# Patient Record
Sex: Male | Born: 1935 | Race: White | Hispanic: No | Marital: Married
Health system: Southern US, Community
[De-identification: ages and names within clinical notes are randomized; demographics above are authoritative.]

## PROBLEM LIST (undated history)

## (undated) ENCOUNTER — Ambulatory Visit (HOSPITAL_COMMUNITY): Admission: EM | Payer: Medicare Other

## (undated) DIAGNOSIS — I739 Peripheral vascular disease, unspecified: Secondary | ICD-10-CM

## (undated) DIAGNOSIS — F32A Depression, unspecified: Secondary | ICD-10-CM

## (undated) DIAGNOSIS — E119 Type 2 diabetes mellitus without complications: Secondary | ICD-10-CM

## (undated) DIAGNOSIS — E785 Hyperlipidemia, unspecified: Secondary | ICD-10-CM

## (undated) DIAGNOSIS — Z89619 Acquired absence of unspecified leg above knee: Secondary | ICD-10-CM

## (undated) DIAGNOSIS — Z89519 Acquired absence of unspecified leg below knee: Secondary | ICD-10-CM

## (undated) DIAGNOSIS — M1990 Unspecified osteoarthritis, unspecified site: Secondary | ICD-10-CM

## (undated) DIAGNOSIS — I219 Acute myocardial infarction, unspecified: Secondary | ICD-10-CM

## (undated) DIAGNOSIS — F329 Major depressive disorder, single episode, unspecified: Secondary | ICD-10-CM

## (undated) DIAGNOSIS — K219 Gastro-esophageal reflux disease without esophagitis: Secondary | ICD-10-CM

## (undated) DIAGNOSIS — D649 Anemia, unspecified: Secondary | ICD-10-CM

## (undated) DIAGNOSIS — G459 Transient cerebral ischemic attack, unspecified: Secondary | ICD-10-CM

## (undated) DIAGNOSIS — N400 Enlarged prostate without lower urinary tract symptoms: Secondary | ICD-10-CM

## (undated) DIAGNOSIS — L8990 Pressure ulcer of unspecified site, unspecified stage: Secondary | ICD-10-CM

## (undated) DIAGNOSIS — M810 Age-related osteoporosis without current pathological fracture: Secondary | ICD-10-CM

## (undated) DIAGNOSIS — Z951 Presence of aortocoronary bypass graft: Secondary | ICD-10-CM

## (undated) DIAGNOSIS — I10 Essential (primary) hypertension: Secondary | ICD-10-CM

## (undated) DIAGNOSIS — R4701 Aphasia: Secondary | ICD-10-CM

## (undated) DIAGNOSIS — I2510 Atherosclerotic heart disease of native coronary artery without angina pectoris: Secondary | ICD-10-CM

## (undated) DIAGNOSIS — R627 Adult failure to thrive: Secondary | ICD-10-CM

## (undated) HISTORY — PX: CARDIAC CATHETERIZATION: SHX172

## (undated) HISTORY — PX: VASCULAR SURGERY: SHX849

---

## 2005-03-03 ENCOUNTER — Ambulatory Visit (HOSPITAL_COMMUNITY): Admission: RE | Admit: 2005-03-03 | Discharge: 2005-03-03 | Payer: Self-pay | Admitting: Cardiology

## 2005-03-11 ENCOUNTER — Inpatient Hospital Stay (HOSPITAL_COMMUNITY): Admission: RE | Admit: 2005-03-11 | Discharge: 2005-03-17 | Payer: Self-pay | Admitting: *Deleted

## 2005-07-08 ENCOUNTER — Ambulatory Visit (HOSPITAL_COMMUNITY): Admission: RE | Admit: 2005-07-08 | Discharge: 2005-07-09 | Payer: Self-pay | Admitting: *Deleted

## 2005-10-21 ENCOUNTER — Other Ambulatory Visit: Payer: Self-pay

## 2005-10-21 ENCOUNTER — Inpatient Hospital Stay: Payer: Self-pay | Admitting: Internal Medicine

## 2006-03-11 ENCOUNTER — Ambulatory Visit: Payer: Self-pay | Admitting: Unknown Physician Specialty

## 2006-03-28 ENCOUNTER — Ambulatory Visit: Payer: Self-pay | Admitting: Unknown Physician Specialty

## 2006-04-14 ENCOUNTER — Inpatient Hospital Stay: Payer: Self-pay | Admitting: Specialist

## 2006-04-15 ENCOUNTER — Other Ambulatory Visit: Payer: Self-pay

## 2006-04-23 ENCOUNTER — Encounter: Payer: Self-pay | Admitting: Internal Medicine

## 2006-10-12 ENCOUNTER — Ambulatory Visit: Payer: Self-pay | Admitting: *Deleted

## 2006-12-24 ENCOUNTER — Emergency Department: Payer: Self-pay | Admitting: Emergency Medicine

## 2007-01-20 ENCOUNTER — Ambulatory Visit: Payer: Self-pay | Admitting: Internal Medicine

## 2007-02-05 ENCOUNTER — Ambulatory Visit: Payer: Self-pay | Admitting: Internal Medicine

## 2007-03-07 ENCOUNTER — Ambulatory Visit: Payer: Self-pay | Admitting: Internal Medicine

## 2007-04-07 ENCOUNTER — Ambulatory Visit: Payer: Self-pay | Admitting: Internal Medicine

## 2007-04-27 ENCOUNTER — Ambulatory Visit: Payer: Self-pay | Admitting: *Deleted

## 2007-05-08 ENCOUNTER — Ambulatory Visit: Payer: Self-pay | Admitting: Internal Medicine

## 2007-06-07 ENCOUNTER — Ambulatory Visit: Payer: Self-pay | Admitting: Internal Medicine

## 2007-07-08 ENCOUNTER — Ambulatory Visit: Payer: Self-pay | Admitting: Internal Medicine

## 2007-08-07 ENCOUNTER — Ambulatory Visit: Payer: Self-pay | Admitting: Internal Medicine

## 2007-09-07 ENCOUNTER — Ambulatory Visit: Payer: Self-pay | Admitting: Internal Medicine

## 2007-10-08 ENCOUNTER — Ambulatory Visit: Payer: Self-pay | Admitting: Internal Medicine

## 2007-11-02 ENCOUNTER — Ambulatory Visit: Payer: Self-pay | Admitting: *Deleted

## 2007-11-05 ENCOUNTER — Ambulatory Visit: Payer: Self-pay | Admitting: Internal Medicine

## 2007-11-28 ENCOUNTER — Ambulatory Visit: Payer: Self-pay | Admitting: Internal Medicine

## 2007-12-06 ENCOUNTER — Ambulatory Visit: Payer: Self-pay | Admitting: Internal Medicine

## 2008-02-01 ENCOUNTER — Ambulatory Visit: Payer: Self-pay | Admitting: *Deleted

## 2008-02-07 ENCOUNTER — Ambulatory Visit: Payer: Self-pay | Admitting: Family Medicine

## 2008-02-08 ENCOUNTER — Ambulatory Visit: Payer: Self-pay | Admitting: Family Medicine

## 2008-02-26 ENCOUNTER — Ambulatory Visit: Payer: Self-pay | Admitting: Internal Medicine

## 2008-03-06 ENCOUNTER — Ambulatory Visit: Payer: Self-pay | Admitting: Internal Medicine

## 2008-03-18 ENCOUNTER — Ambulatory Visit: Payer: Self-pay | Admitting: Vascular Surgery

## 2008-05-31 ENCOUNTER — Ambulatory Visit: Payer: Self-pay | Admitting: Internal Medicine

## 2008-06-06 ENCOUNTER — Ambulatory Visit: Payer: Self-pay | Admitting: Internal Medicine

## 2008-09-06 ENCOUNTER — Ambulatory Visit: Payer: Self-pay | Admitting: Internal Medicine

## 2008-09-10 ENCOUNTER — Ambulatory Visit: Payer: Self-pay | Admitting: Internal Medicine

## 2008-10-07 ENCOUNTER — Ambulatory Visit: Payer: Self-pay | Admitting: Internal Medicine

## 2009-07-07 ENCOUNTER — Ambulatory Visit: Payer: Self-pay | Admitting: Internal Medicine

## 2009-07-25 ENCOUNTER — Ambulatory Visit: Payer: Self-pay | Admitting: Internal Medicine

## 2009-08-06 ENCOUNTER — Ambulatory Visit: Payer: Self-pay | Admitting: Internal Medicine

## 2009-09-06 ENCOUNTER — Ambulatory Visit: Payer: Self-pay | Admitting: Internal Medicine

## 2009-10-07 ENCOUNTER — Ambulatory Visit: Payer: Self-pay | Admitting: Internal Medicine

## 2009-11-04 ENCOUNTER — Ambulatory Visit: Payer: Self-pay | Admitting: Internal Medicine

## 2009-12-05 ENCOUNTER — Ambulatory Visit: Payer: Self-pay | Admitting: Internal Medicine

## 2010-01-04 ENCOUNTER — Ambulatory Visit: Payer: Self-pay | Admitting: Internal Medicine

## 2010-02-13 ENCOUNTER — Ambulatory Visit: Payer: Self-pay | Admitting: Internal Medicine

## 2010-03-06 ENCOUNTER — Ambulatory Visit: Payer: Self-pay | Admitting: Internal Medicine

## 2010-04-06 ENCOUNTER — Ambulatory Visit: Payer: Self-pay | Admitting: Internal Medicine

## 2010-05-07 ENCOUNTER — Ambulatory Visit: Payer: Self-pay | Admitting: Internal Medicine

## 2010-05-22 ENCOUNTER — Ambulatory Visit: Payer: Self-pay | Admitting: Internal Medicine

## 2010-06-06 ENCOUNTER — Ambulatory Visit: Payer: Self-pay | Admitting: Internal Medicine

## 2010-07-07 ENCOUNTER — Ambulatory Visit: Payer: Self-pay | Admitting: Internal Medicine

## 2010-07-09 ENCOUNTER — Emergency Department: Payer: Self-pay | Admitting: Emergency Medicine

## 2010-07-10 ENCOUNTER — Ambulatory Visit: Payer: Self-pay | Admitting: Internal Medicine

## 2010-10-15 ENCOUNTER — Inpatient Hospital Stay: Payer: Self-pay | Admitting: Internal Medicine

## 2010-10-20 ENCOUNTER — Encounter: Payer: Self-pay | Admitting: Internal Medicine

## 2010-11-05 ENCOUNTER — Ambulatory Visit: Payer: Self-pay | Admitting: Oncology

## 2010-11-05 ENCOUNTER — Ambulatory Visit: Payer: Self-pay | Admitting: Internal Medicine

## 2010-11-05 ENCOUNTER — Encounter: Payer: Self-pay | Admitting: Internal Medicine

## 2010-11-13 ENCOUNTER — Inpatient Hospital Stay: Payer: Self-pay | Admitting: Internal Medicine

## 2010-11-14 DIAGNOSIS — I219 Acute myocardial infarction, unspecified: Secondary | ICD-10-CM

## 2010-12-06 ENCOUNTER — Encounter: Payer: Self-pay | Admitting: Internal Medicine

## 2010-12-06 ENCOUNTER — Ambulatory Visit: Payer: Self-pay | Admitting: Oncology

## 2010-12-06 ENCOUNTER — Ambulatory Visit: Payer: Self-pay | Admitting: Internal Medicine

## 2011-01-05 ENCOUNTER — Encounter: Payer: Self-pay | Admitting: Internal Medicine

## 2011-01-19 NOTE — Procedures (Signed)
BYPASS GRAFT EVALUATION   INDICATION:  Followup right leg bypass graft.   HISTORY:  Diabetes:  Yes.  Cardiac:  MI in 1999.  Hypertension:  Yes.  Smoking:  No.  Previous Surgery:  Right popliteal-to-dorsalis pedis bypass graft with  reverse saphenous vein on 03/11/2005 by Dr. Madilyn Fireman.  Left below-knee  amputation in [place] 6 years ago.   SINGLE LEVEL ARTERIAL EXAM                               RIGHT              LEFT  Brachial:  Anterior tibial:  Posterior tibial:  Peroneal:  Ankle/brachial index:   PREVIOUS ABI:  Date:  RIGHT:  LEFT:   LOWER EXTREMITY BYPASS GRAFT DUPLEX EXAM:   DUPLEX:  Doppler arterial wave forms are biphasic proximal to,  throughout and distal to the graft.   IMPRESSION:  1. Patent right popliteal to dorsalis pedis bypass graft.  2. Slightly elevated velocities were detected in the mid to distal      graft.  3. ABIs not obtained secondary to medial calcification.      ___________________________________________  P. Liliane Bade, M.D.   DP/MEDQ  D:  11/02/2007  T:  11/03/2007  Job:  [id number]

## 2011-01-19 NOTE — Procedures (Signed)
BYPASS GRAFT EVALUATION   INDICATION:  Followup right lower extremity bypass graft.   HISTORY:  Diabetes:  Yes  Cardiac:  MI in 1999  Hypertension:  Yes  Smoking:  No  Previous Surgery:  Right popliteal to dorsalis pedis bypass graft with  reversed saphenous vein on 03/11/05.   SINGLE LEVEL ARTERIAL EXAM                               RIGHT              LEFT  Brachial:  Anterior tibial:  Posterior tibial:  Peroneal:  Ankle/brachial index:   PREVIOUS ABI:  Date:  RIGHT:  LEFT:  ABIs were not obtained due to previously documented right medial  calcification and left BKA.   LOWER EXTREMITY BYPASS GRAFT DUPLEX EXAM:  See attached sheet for  velocities.   DUPLEX:  Arterial Doppler wave forms are biphasic proximal to within and  distal to bypass graft. Elevated velocities in focal spot in the  proximal/mid graft of 292/69 cm/second suggestive of 50% to 75%  stenosis.   IMPRESSION:  Patent right popliteal to dorsalis pedis artery bypass  graft with focal point of elevated velocities. No significant changes  from previous study (elevated velocities were 290 cm/second last study).   ___________________________________________  P. Liliane Bade, M.D.   AS/MEDQ  D:  04/27/2007  T:  04/28/2007  Job:  [id number]

## 2011-01-19 NOTE — Assessment & Plan Note (Signed)
OFFICE VISIT   Nicholas [name], Nicholas [name]  DOB:  1931-04-12                                       02/01/2008  CHART#:[id number]   The patient presented today with some complaints of left thigh pain.  His history dates back to a left BKA.  He has a right popliteal dorsalis  pedis bypass.   His current symptoms relate to discomfort in the mid thigh with  ambulation.  This is anterior.  He also notes some left flank discomfort  and left lower quadrant discomfort which is being worked up by his  family physician.   His blood pressure is 95/51, pulse is 68 per minute.  Lower extremity  evaluation reveals intact femoral pulses bilaterally.  Left leg reveals  a BKA.  Prosthesis is fitting well.  Good range of motion.  No muscle  deformity.   The patient was reassured that his circulation his left leg is adequate  at this time.  No further workup required.  Symptoms appear to be  musculoskeletal in nature.   Followup per protocol for a right popliteal dorsalis pedis bypass.   Balinda Quails, M.D.  Electronically Signed   PGH/MEDQ  D:  02/01/2008  T:  02/02/2008  Job:  [id number]

## 2011-01-22 NOTE — Op Note (Signed)
NAME:  Nicholas [name], Nicholas [name] NO.:  [id number]   MEDICAL RECORD NO.:  [id number]          PATIENT TYPE:  OIB   LOCATION:  6529                         FACILITY:  [facility]   PHYSICIAN:  Balinda Quails, M.D.    DATE OF BIRTH:  03/16/1931   DATE OF PROCEDURE:  07/08/2005  DATE OF DISCHARGE:  07/09/2005                                 OPERATIVE REPORT   PHYSICIAN:  Denman George, MD   DIAGNOSIS:  Right popliteal stenosis.   PROCEDURE:  1.  Selective right lower extremity arteriogram.  2.  Right popliteal percutaneous transluminal angioplasty.   ACCESS:  Left common femoral artery 6-French sheath.   CONTRAST:  50 mL of Visipaque.   COMPLICATIONS:  None apparent.   CLINICAL NOTE:  Nicholas [name] is a 77 year old diabetic male with  history of advanced peripheral vascular disease.  He is status post left  lower extremity amputation.  He has previously undergone a right popliteal-  to-dorsalis-pedis bypass for limb salvage.  He began to complain of new-  onset claudication symptoms in his right calf.  Duplex evaluation revealed a  high-grade stenosis in the popliteal artery proximal to the takeoff of the  bypass graft.  He is brought to the cath lab at this time for diagnostic  arteriography and possible intervention.   PROCEDURE NOTE:  The patient was brought to the cath lab in stable  condition.  Placed in supine position.   Left groin prepped and draped in a sterile fashion.  Skin and subcutaneous  tissues instilled with 1% Xylocaine.   A needle was easily introduced in the left common femoral artery.  A 0.035  Wholey guidewire was advanced through the needle into the mid-abdominal  aorta.  A short 5-French sheath was then advanced over the guidewire.  The  dilator removed and sheath flushed with heparin and saline solution.  An IMA  crossover catheter was then advanced over the guidewire.  This was engaged  into the right common iliac artery.  The guidewire  removed and a 0.035 Glide  wire was advanced into the right iliac system.  This was a advanced down  into t he proximal right superficial femoral artery.  The IMA catheter  removed and a 4-French end-hole catheter advanced over the guidewire into  the right superficial femoral artery.  Guidewire exchange was then made for  the [facility] guidewire once again.  With the [facility] guidewire in place in the  right superficial femoral artery, the 5-French sheath was removed and a 6-  French Terumo sheath was advanced over the guidewire, over the bifurcation  and into the right external iliac artery.  The dilator removed.  Selective  right lower extremity arteriography obtained.  This verified a high-grade  stenosis of the right popliteal artery just proximal to the takeoff of the  vein bypass graft.   Several views were taken to clearly delineate the lesion in the right  popliteal artery.  The popliteal-anterior tibial bypass graft was patent,  although flow was very sluggish.   A 0.014 ASAHI medium guidewire was then advanced down  the right superficial  femoral artery and across the right popliteal lesion.  This was a severe 90%  stenosis of the right popliteal artery just proximal of the takeoff of the  vein graft.  The patient was administered a standard dose Angiomax bolus and  infusion.  Following Angiomax administration, ACT was 323 seconds.  A 4 x 2  SLALOM balloon was then advanced over the guidewire, positioned at the area  of right popliteal stenosis and inflated at 6 atmospheres x30 seconds.  Following this, a completion arteriogram obtained.  There was a mild  residual stenosis.  The balloon was then reinflated to 10 atmospheres for 30  seconds.  A completion arteriogram then carried out.  This revealed an  excellent technical result.  There was improved flow through the right  popliteal-dorsalis pedis bypass.   This completed the arteriogram procedure.  The guidewire and balloon  were  removed.  The sheath pulled back in the left external iliac artery.   FINAL IMPRESSION:  1.  Severe right popliteal artery stenosis proximal to the takeoff of the      right popliteal-dorsalis pedis bypass graft. 2.  2.  Successful      angioplasty, right popliteal stenosis, without apparent complication and      minimal residual stenosis.      Balinda Quails, M.D.  Electronically Signed     PGH/MEDQ  D:  07/11/2005  T:  07/12/2005  Job:  [id number]   cc:   [facility]

## 2011-01-22 NOTE — Discharge Summary (Signed)
NAME:  Nicholas [name], Nicholas [name] NO.:  [id number]   MEDICAL RECORD NO.:  [id number]          PATIENT TYPE:  INP   LOCATION:  2017                         FACILITY:  [facility]   PHYSICIAN:  Balinda Quails, M.D.    DATE OF BIRTH:  November 21, 1930   DATE OF ADMISSION:  03/11/2005  DATE OF DISCHARGE:  03/17/2005                                 DISCHARGE SUMMARY   ADMISSION DIAGNOSIS:  Peripheral vascular occlusive disease with ischemic  right foot and nonhealing ulcer of the right foot.   SECONDARY DIAGNOSES:  1.  The same as admission diagnosis.  2.  Postoperative acute blood loss anemia status post transfusion.  3.  Coronary artery disease.  4.  Diabetes mellitus type 2.  5.  Hypertension.  6.  Hyperlipidemia.  7.  History of bilateral cataract extraction.  8.  History of left below knee amputation.  9.  History of gastrointestinal bleed in 2000.  10. Benign prosthetic hyperplasia.  11. Diabetic retinopathy.   ALLERGIES:  No known drug allergies.   PROCEDURES:  1.  In March 11, 2005:  Right popliteal to anterior tibial bypass with two      segment composite reverse saphenous vein graft, endoscopic saphenous      vein harvesting from the right leg. Surgeon Dr. Balinda Quails.  2.  Postoperative lower extremity arterial evaluation of March 12, 2005 with      ankle brachial indices 0.86 on the right with monophasic anterior and      posterior tibial Doppler signals on the right.   BRIEF HISTORY:  Nicholas [name] is a 77 year old Caucasian male with 35-history of  type 2 diabetes mellitus. He is status post a left below-knee amputation  secondary to diabetic associated peripheral vascular disease. He has also  previously undergone right great toe amputation and presented to Dr. Madilyn Fireman  on March 08, 2005 with an ulcer at the base of his right second toe. Right  lower extremity arteriography was done which showed no evidence of aorto  iliac occlusive disease. His right profunda femoris was  patent. He had mild  to moderate irregular disease of the right superficial femoral artery with  areas of 25-50% stenosis. The popliteal artery occluded below the knee.  There was reconstitution of the anterior tibial and dorsalis pedis artery  proximal to the ankle joint. Based on these findings, Dr. Madilyn Fireman recommend  surgical revascularization via right popliteal to dorsalis pedis bypass for  limb salvage with date evaluation of his right second toe for healing. After  discussing risks, benefits and alternatives, [name] agreed to proceed.   HOSPITAL COURSE:  On March 11, 2005, Nicholas [name] was admitted to [facility] and taken to the operating room for right popliteal to  anterior tibial bypass. There were no known intraoperative complications and  the patient was transferred to unit 3300 with routine triple bypass  postoperative orders. By this time, he had been extubated and neurologically  intact. On postoperative day one, Nicholas [name] remained stable overnight.  Postoperative labs did show decreased hemoglobin and hematocrit of 7.8 and  22 which was treated with transfusion 1 unit packed red blood cells.  Otherwise Nicholas [name]'s incisions were clean and dry with palpable graft and  dorsalis pedis pulse. Of note, the patient did receive tobacco cessation  consult on postoperative day one, although he reported that he had no desire  to quit his one cigar per day habit. By postoperative day two, [name] had  been transferred out of the step-down unit to unit 2000. He remained on this  unit until discharge. During this time, he remained hemodynamically stable  and afebrile. He was weaned from supplemental oxygen and was saturating  above 95%. He continued to have a palpable graft and dorsalis pedis pulse.  His incisions continued to show signs of good healing without infection. He  did not require an additional blood transfusion as his hemoglobin remained  stable with a H&H  of time of discharge of 9.4 and 27.0. He was started on  oral iron and oral iron supplement. In regards to mobilization, he was  evaluated by physical therapy.   On his first attempt of walking, he did appear to have a vasovagal response  with a decreased blood pressure of 82/42. His heart rate remained in the 70s  in sinus rhythm. Blood pressure increased to 100/60, went back to bed and  there was no syncopal episodes. Other than this event, he made relatively  good progress with use of his prosthetic leg on the left with using a cane.  Home Health physical therapy, however, was arranged.   Postoperatively, Nicholas [name] did tolerate a regular carbohydrate modified  diet. His blood sugars were managed initially with NovoLog insulin sliding  scale as well as his home regimen of glipizide. Metformin was restarted by  discharge as his creatinine remained stable at 1.4 for several days  postoperatively. His pain was controlled on oral medications. By  postoperative day six, it was felt that he made good progression with  mobilization and was now ready for discharge home. Nicholas [name] was discharged  home on March 17, 2005 in stable and improved condition.   LABORATORY DATA:  Labs prior to discharge showed a sodium 139, potassium 3.6  which was supplemented, chloride 102, CO2 26, BUN 24, creatinine 1.4, blood  glucose 157. Hemoglobin 9.4 medical 27.0 white blood count 6.3, platelet  count 226,000. SGOT 21, SGPT 13, alkaline phosphatase 61, total bilirubin of  0.6, blood albumin 3.1.   DISCHARGE MEDICATIONS:  1.  Omeprazole 20 milligrams p.o. b.i.d.  2.  Flomax 0.4 milligrams p.o. q.d.  3.  Glipizide 10 milligrams p.o. b.i.d.  4.  Metformin 500 milligrams p.o. t.i.d.  5.  Lisinopril 20 milligrams p.o. b.i.d.  6.  Simvastatin 20 milligrams p.o. q.h.s.  7.  Toprol 50 milligrams p.o. b.i.d.  8.  Loperamide 2 milligrams b.i.d. p.r.n.  9.  Plavix 75 milligrams p.o. q.d. 10. Aspirin 81 milligrams  p.o. q.d.  11. Loratadine 10 milligrams p.o. q.d.  12. Fish oil capsules 1000 milligrams p.o. b.i.d.  13. Vitamin B12 1000 mcg p.o. q.d.  14. Tylox 1-2 tablets p.o. q.4h. p.r.n. pain.   DISCHARGE INSTRUCTIONS:  He is to resume his diabetic appropriate diet. He  may shower and clean his incisions gently with mild soap and water. He  should notify the CVTS office if he develops fever greater than 101 or  redness or drainage from his incision sites or for increasing pain. He is to  avoid heavy lifting or driving until reevaluated by Dr. Madilyn Fireman.  He may  ambulate with assistance as needed and home health physical therapy through  [facility] has been arranged.   FOLLOW-UP:  He is to have follow-up appointment with Dr. Madilyn Fireman at [facility]  office on April 13, 2005 at 2 p.m. He is to have lower extremity arterial  Doppler studies and ABIs at this appointment as well. He is to have a nurse  visit at the CVTS office for staple removal on Wednesday March 24, 2005 at  10:30 a.m.       AWZ/MEDQ  D:  03/17/2005  T:  03/17/2005  Job:  [id number]   cc:   Patient's chart   P. Liliane Bade, M.D.  [address]  [place]  [place] [address]   Arbutus Ped, M.D.   Lorre Munroe., M.D.  [address]  [place]  [place] [address]

## 2011-01-22 NOTE — Op Note (Signed)
NAME:  Nicholas [name], Nicholas [name] NO.:  [id number]   MEDICAL RECORD NO.:  [id number]          PATIENT TYPE:  INP   LOCATION:  3311                         FACILITY:  [facility]   PHYSICIAN:  Balinda Quails, M.D.    DATE OF BIRTH:  19-Jan-1931   DATE OF PROCEDURE:  03/11/2005  DATE OF DISCHARGE:                                 OPERATIVE REPORT   SURGEON:  Denman George, M.D.   ASSISTANTS:  Fabienne Bruns, M.D. and Gershon Crane, P.A.   ANESTHETIC:  General endotracheal.   ANESTHESIOLOGIST:  Bedelia Person, M.D.   PREOPERATIVE DIAGNOSIS:  Ischemic right foot.   POSTOPERATIVE DIAGNOSIS:  Ischemic right foot.   PROCEDURE.:  1.  Right popliteal-to-anterior-tibial bypass with 2-segment composite      reversed saphenous vein graft.  2.  Endoscopic saphenous vein harvesting.   CLINICAL NOTE:  Nicholas [name] is a 77 year old male with a 35-year history  of type 2 diabetes.  He is status post left below-knee amputation.  He has  had a right first toe ray amputation previously performed.  He presented  with an ischemic right second toe.  He was worked up by Dr. Park Liter,  revealing extensive infrapopliteal disease.  Patent right popliteal artery,  occlusion of all tibial vessels with reconstitution of the anterior tibial  artery continuous with the dorsalis pedis in the right foot.   For limb salvage, he is brought to the operating at this time for  revascularization with planned right popliteal-to-anterior-tibial bypass.   OPERATIVE PROCEDURE:  The patient was brought to the operating room in  stable condition.  Placed in supine position.  General endotracheal  anesthesia induced.  Right leg prepped and draped in a sterile fashion.   Initial harvesting of the saphenous vein was carried out with endoscopic  technique.  Incision made at the right medial femoral condyle.  The  endoscopic device was inserted and saphenous vein in the right thigh  harvested from the right femoral  condyle to the saphenofemoral junction.  A  small right groin stab incision was made to remove the vein and this was  brought out through the distal incision, ligated with 2-0 silk and removed.  The vein was then dilated with heparin and saline solution.  There was a  short segment of vein which was of poor quality and thin-walled.  This had  to be resected.  This did not leave enough vein to carry out the planned  bypass procedure.   The popliteal fossa was entered through a medial curvilinear incision.  Subcutaneous tissue was incised with electrocautery.  The gastrocnemius  fascia was taken down.  The distal right popliteal artery was freed.  This  revealed an excellent pulse.  There was disease with calcified plaque.  Encircled with Vesseloops proximally and distally.   Saphenous vein was then exposed through this popliteal incision.  Separated  longitudinal incisions were then made along the calf down to the medial  malleolus.  Saphenous vein was exposed.  Tributaries ligated with 4-0 silk  and 3-0 silk, and divided.  The vein then ligated distally  with 2-0 silk and  removed.  The vein dilated with heparin and saline solution.  The distal  segment of harvested vein was 4-5 mm in size.  This was not adequate length,  however.   The distal anterior tibial artery was exposed proximal to the ankle joint.  The aponeurosis was incised.  The artery exposed; it was 2 to 2.5 mm in  diameter.  This was freed and encircled with a Vesseloop.   The 2 segments of vein which were harvested were then examined.  Adequate  length of vein would require composite 2-segment vein graft.  The 2 pieces  of vein were anastomosed to one another in an end-to-end fashion with  interrupted 7-0 Prolene suture.  At completion of this, the vein was  flushed.  It was adequate in terms of quality and size.   The patient was then administered 7000 units of heparin intravenously.  Adequate circulation time  permitted.  The right popliteal artery controlled  with bulldog clamps.  A longitudinal arteriotomy made.  The reversed vein  was then anastomosed end-to-side to the popliteal artery using running 6-0  Prolene suture.  At completion of this, the popliteal clamps were removed.  Excellent flow was present through the vein.  Then vein was then brought  along this cutaneous saphenous vein bed and tunnel across the anterior  surface of the tibia.  The anterior tibial artery was controlled proximally  and distally with serrefine clamps.  A longitudinal arteriotomy was made.  This was 2 to 2.5 mm in diameter.  The vein graft was beveled and  anastomosed end-to-side to the anterior tibial artery using running 7-0  Prolene suture.  At completion of this, the vein was flushed.  Clamps were  removed.  Excellent flow present.  Adequate hemostasis obtained.  Sponge and  instrument counts correct.   The patient was administered 30 mg of protamine intravenously.   The anterior tibial incision was closed with a single layer of interrupted 3-  0 nylon suture.  Releasing waffled-type incisions were then placed around  this.   The popliteal incision was closed with running 2-0 Vicryl suture in a single  layer.  Staples were applied to the skin.   The vein harvest incisions were closed with running 3-0 Vicryl sutures in  the subcutaneous layer and staples applied to the skin.   The stab incision in the right groin was closed with interrupted 4-0 Vicryl  suture.   The patient tolerated procedure well.  No apparent complications.  The  patient was transferred to the recovery room in stable condition.       PGH/MEDQ  D:  03/11/2005  T:  03/12/2005  Job:  [id number]   cc:   Clearence Ped , MD

## 2011-01-25 ENCOUNTER — Ambulatory Visit: Payer: Self-pay | Admitting: Internal Medicine

## 2011-02-05 ENCOUNTER — Ambulatory Visit: Payer: Self-pay | Admitting: Internal Medicine

## 2011-02-05 ENCOUNTER — Encounter: Payer: Self-pay | Admitting: Internal Medicine

## 2011-03-07 ENCOUNTER — Encounter: Payer: Self-pay | Admitting: Internal Medicine

## 2011-04-07 ENCOUNTER — Encounter: Payer: Self-pay | Admitting: Internal Medicine

## 2012-08-29 ENCOUNTER — Inpatient Hospital Stay: Payer: Self-pay | Admitting: Internal Medicine

## 2012-08-29 LAB — COMPREHENSIVE METABOLIC PANEL
Albumin: 3.3 g/dL — ABNORMAL LOW (ref 3.4–5.0)
Alkaline Phosphatase: 119 U/L (ref 50–136)
BUN: 53 mg/dL — ABNORMAL HIGH (ref 7–18)
Bilirubin,Total: 0.3 mg/dL (ref 0.2–1.0)
Co2: 22 mmol/L (ref 21–32)
Creatinine: 2.14 mg/dL — ABNORMAL HIGH (ref 0.60–1.30)
Osmolality: 295 (ref 275–301)
Potassium: 5.3 mmol/L — ABNORMAL HIGH (ref 3.5–5.1)
Sodium: 138 mmol/L (ref 136–145)
Total Protein: 6.4 g/dL (ref 6.4–8.2)

## 2012-08-29 LAB — CBC
HCT: 31.8 % — ABNORMAL LOW (ref 40.0–52.0)
HGB: 9.9 g/dL — ABNORMAL LOW (ref 13.0–18.0)
MCHC: 31.1 g/dL — ABNORMAL LOW (ref 32.0–36.0)
MCV: 104 fL — ABNORMAL HIGH (ref 80–100)
RBC: 3.05 10*6/uL — ABNORMAL LOW (ref 4.40–5.90)

## 2012-08-29 LAB — PROTIME-INR
INR: 0.8
Prothrombin Time: 11.6 secs (ref 11.5–14.7)

## 2012-08-30 LAB — CBC WITH DIFFERENTIAL/PLATELET
Basophil #: 0 10*3/uL (ref 0.0–0.1)
Eosinophil #: 0 10*3/uL (ref 0.0–0.7)
Eosinophil %: 0.3 %
HGB: 10.9 g/dL — ABNORMAL LOW (ref 13.0–18.0)
Lymphocyte #: 1.4 10*3/uL (ref 1.0–3.6)
MCH: 34.4 pg — ABNORMAL HIGH (ref 26.0–34.0)
MCHC: 33.6 g/dL (ref 32.0–36.0)
MCV: 102 fL — ABNORMAL HIGH (ref 80–100)
Monocyte #: 0.5 x10 3/mm (ref 0.2–1.0)
Neutrophil #: 8.3 10*3/uL — ABNORMAL HIGH (ref 1.4–6.5)
Platelet: 163 10*3/uL (ref 150–440)
RBC: 3.17 10*6/uL — ABNORMAL LOW (ref 4.40–5.90)
RDW: 13.8 % (ref 11.5–14.5)

## 2012-08-30 LAB — BASIC METABOLIC PANEL
Anion Gap: 7 (ref 7–16)
BUN: 56 mg/dL — ABNORMAL HIGH (ref 7–18)
Co2: 25 mmol/L (ref 21–32)
Creatinine: 2.08 mg/dL — ABNORMAL HIGH (ref 0.60–1.30)
EGFR (African American): 34 — ABNORMAL LOW
EGFR (Non-African Amer.): 29 — ABNORMAL LOW
Glucose: 157 mg/dL — ABNORMAL HIGH (ref 65–99)
Osmolality: 294 (ref 275–301)
Sodium: 138 mmol/L (ref 136–145)

## 2012-08-30 LAB — POTASSIUM: Potassium: 4.9 mmol/L (ref 3.5–5.1)

## 2012-08-30 LAB — LIPID PANEL: Ldl Cholesterol, Calc: 37 mg/dL (ref 0–100)

## 2012-08-30 LAB — HEMOGLOBIN A1C: Hemoglobin A1C: 7.8 % — ABNORMAL HIGH (ref 4.2–6.3)

## 2012-09-04 LAB — CULTURE, BLOOD (SINGLE)

## 2012-12-29 ENCOUNTER — Inpatient Hospital Stay: Payer: Self-pay | Admitting: Internal Medicine

## 2012-12-29 LAB — CBC WITH DIFFERENTIAL/PLATELET
Basophil #: 0 10*3/uL (ref 0.0–0.1)
Eosinophil #: 0.5 10*3/uL (ref 0.0–0.7)
HGB: 10.1 g/dL — ABNORMAL LOW (ref 13.0–18.0)
Lymphocyte %: 25 %
MCH: 33.8 pg (ref 26.0–34.0)
MCHC: 32.6 g/dL (ref 32.0–36.0)
MCV: 104 fL — ABNORMAL HIGH (ref 80–100)
Monocyte #: 0.5 x10 3/mm (ref 0.2–1.0)
Neutrophil #: 4.7 10*3/uL (ref 1.4–6.5)
RBC: 2.99 10*6/uL — ABNORMAL LOW (ref 4.40–5.90)
WBC: 7.6 10*3/uL (ref 3.8–10.6)

## 2012-12-29 LAB — COMPREHENSIVE METABOLIC PANEL
Albumin: 2.8 g/dL — ABNORMAL LOW (ref 3.4–5.0)
Anion Gap: 6 — ABNORMAL LOW (ref 7–16)
BUN: 42 mg/dL — ABNORMAL HIGH (ref 7–18)
Chloride: 113 mmol/L — ABNORMAL HIGH (ref 98–107)
Co2: 20 mmol/L — ABNORMAL LOW (ref 21–32)
EGFR (African American): 46 — ABNORMAL LOW
Glucose: 222 mg/dL — ABNORMAL HIGH (ref 65–99)
Osmolality: 295 (ref 275–301)
SGPT (ALT): 14 U/L (ref 12–78)
Sodium: 139 mmol/L (ref 136–145)
Total Protein: 7.1 g/dL (ref 6.4–8.2)

## 2012-12-29 LAB — TROPONIN I
Troponin-I: 0.03 ng/mL
Troponin-I: 1.6 ng/mL — ABNORMAL HIGH

## 2012-12-29 LAB — URINALYSIS, COMPLETE
Bacteria: NONE SEEN
Blood: NEGATIVE
Ph: 5 (ref 4.5–8.0)
RBC,UR: 2 /HPF (ref 0–5)
Squamous Epithelial: <1
WBC UR: 1 /HPF (ref 0–5)

## 2012-12-29 LAB — APTT: Activated PTT: 32 secs (ref 23.6–35.9)

## 2012-12-29 LAB — PRO B NATRIURETIC PEPTIDE: B-Type Natriuretic Peptide: 6700 pg/mL — ABNORMAL HIGH (ref 0–450)

## 2012-12-30 LAB — CBC WITH DIFFERENTIAL/PLATELET
Basophil #: 0.1 10*3/uL (ref 0.0–0.1)
Basophil %: 0.8 %
Eosinophil #: 0.5 10*3/uL (ref 0.0–0.7)
HCT: 28.9 % — ABNORMAL LOW (ref 40.0–52.0)
HGB: 9.7 g/dL — ABNORMAL LOW (ref 13.0–18.0)
Lymphocyte #: 2 10*3/uL (ref 1.0–3.6)
MCH: 34.3 pg — ABNORMAL HIGH (ref 26.0–34.0)
MCHC: 33.6 g/dL (ref 32.0–36.0)
MCV: 102 fL — ABNORMAL HIGH (ref 80–100)
Monocyte #: 0.4 x10 3/mm (ref 0.2–1.0)
Neutrophil #: 3.9 10*3/uL (ref 1.4–6.5)
Neutrophil %: 56.9 %
WBC: 6.9 10*3/uL (ref 3.8–10.6)

## 2012-12-30 LAB — TROPONIN I: Troponin-I: 1.5 ng/mL — ABNORMAL HIGH

## 2012-12-30 LAB — LIPID PANEL
Cholesterol: 107 mg/dL (ref 0–200)
HDL Cholesterol: 35 mg/dL — ABNORMAL LOW (ref 40–60)
Ldl Cholesterol, Calc: 63 mg/dL (ref 0–100)
Triglycerides: 47 mg/dL (ref 0–200)

## 2012-12-30 LAB — BASIC METABOLIC PANEL
Anion Gap: 9 (ref 7–16)
Calcium, Total: 8.6 mg/dL (ref 8.5–10.1)
Chloride: 111 mmol/L — ABNORMAL HIGH (ref 98–107)
Co2: 21 mmol/L (ref 21–32)
Creatinine: 1.63 mg/dL — ABNORMAL HIGH (ref 0.60–1.30)
EGFR (African American): 46 — ABNORMAL LOW
EGFR (Non-African Amer.): 39 — ABNORMAL LOW
Sodium: 141 mmol/L (ref 136–145)

## 2012-12-31 LAB — APTT
Activated PTT: 133 secs — ABNORMAL HIGH (ref 23.6–35.9)
Activated PTT: 110.1 secs — ABNORMAL HIGH (ref 23.6–35.9)

## 2012-12-31 LAB — CREATININE, SERUM: Creatinine: 1.76 mg/dL — ABNORMAL HIGH (ref 0.60–1.30)

## 2013-01-01 LAB — APTT: Activated PTT: 54.7 secs — ABNORMAL HIGH (ref 23.6–35.9)

## 2013-01-01 LAB — PLATELET COUNT: Platelet: 186 10*3/uL (ref 150–440)

## 2013-01-02 LAB — CBC WITH DIFFERENTIAL/PLATELET
Basophil #: 0 10*3/uL (ref 0.0–0.1)
Eosinophil #: 0.3 10*3/uL (ref 0.0–0.7)
Eosinophil %: 4.4 %
HGB: 8 g/dL — ABNORMAL LOW (ref 13.0–18.0)
Lymphocyte #: 1.5 10*3/uL (ref 1.0–3.6)
Lymphocyte %: 25.8 %
MCH: 34.2 pg — ABNORMAL HIGH (ref 26.0–34.0)
MCHC: 34.6 g/dL (ref 32.0–36.0)
MCV: 99 fL (ref 80–100)
Monocyte #: 0.5 x10 3/mm (ref 0.2–1.0)
Monocyte %: 7.9 %
Neutrophil #: 3.5 10*3/uL (ref 1.4–6.5)
Neutrophil %: 61.4 %
RDW: 13.5 % (ref 11.5–14.5)

## 2013-01-04 ENCOUNTER — Ambulatory Visit: Payer: Self-pay | Admitting: Internal Medicine

## 2013-01-22 ENCOUNTER — Inpatient Hospital Stay: Payer: Self-pay | Admitting: Family Medicine

## 2013-01-22 LAB — URINALYSIS, COMPLETE
Bilirubin,UR: NEGATIVE
Blood: NEGATIVE
Hyaline Cast: 7
Ketone: NEGATIVE
Nitrite: NEGATIVE
Ph: 5 (ref 4.5–8.0)
Protein: NEGATIVE
RBC,UR: 1 /HPF (ref 0–5)
Squamous Epithelial: NONE SEEN

## 2013-01-22 LAB — CBC WITH DIFFERENTIAL/PLATELET
Basophil #: 0 10*3/uL (ref 0.0–0.1)
Basophil %: 0.3 %
Eosinophil #: 0.3 10*3/uL (ref 0.0–0.7)
Eosinophil %: 2.1 %
HCT: 28.4 % — ABNORMAL LOW (ref 40.0–52.0)
Lymphocyte #: 1.3 10*3/uL (ref 1.0–3.6)
MCH: 31.3 pg (ref 26.0–34.0)
MCHC: 33 g/dL (ref 32.0–36.0)
MCV: 95 fL (ref 80–100)
Monocyte #: 0.6 x10 3/mm (ref 0.2–1.0)
Neutrophil %: 82.5 %
Platelet: 399 10*3/uL (ref 150–440)
RBC: 2.99 10*6/uL — ABNORMAL LOW (ref 4.40–5.90)
WBC: 12.5 10*3/uL — ABNORMAL HIGH (ref 3.8–10.6)

## 2013-01-22 LAB — COMPREHENSIVE METABOLIC PANEL
Alkaline Phosphatase: 76 U/L (ref 50–136)
BUN: 86 mg/dL — ABNORMAL HIGH (ref 7–18)
Bilirubin,Total: 0.4 mg/dL (ref 0.2–1.0)
Chloride: 95 mmol/L — ABNORMAL LOW (ref 98–107)
Co2: 29 mmol/L (ref 21–32)
EGFR (Non-African Amer.): 22 — ABNORMAL LOW
Potassium: 3.4 mmol/L — ABNORMAL LOW (ref 3.5–5.1)
SGOT(AST): 75 U/L — ABNORMAL HIGH (ref 15–37)
Sodium: 133 mmol/L — ABNORMAL LOW (ref 136–145)

## 2013-01-22 LAB — PROTIME-INR: INR: 1.1

## 2013-01-22 LAB — CK TOTAL AND CKMB (NOT AT ARMC): CK, Total: 1558 U/L — ABNORMAL HIGH (ref 35–232)

## 2013-01-22 LAB — APTT: Activated PTT: 32.9 secs (ref 23.6–35.9)

## 2013-01-23 LAB — BASIC METABOLIC PANEL
BUN: 77 mg/dL — ABNORMAL HIGH (ref 7–18)
Calcium, Total: 8.9 mg/dL (ref 8.5–10.1)
Chloride: 100 mmol/L (ref 98–107)
Co2: 27 mmol/L (ref 21–32)
EGFR (African American): 32 — ABNORMAL LOW
Glucose: 84 mg/dL (ref 65–99)
Osmolality: 296 (ref 275–301)
Sodium: 137 mmol/L (ref 136–145)

## 2013-01-23 LAB — CBC WITH DIFFERENTIAL/PLATELET
Basophil #: 0.1 10*3/uL (ref 0.0–0.1)
HCT: 26.9 % — ABNORMAL LOW (ref 40.0–52.0)
HGB: 9.2 g/dL — ABNORMAL LOW (ref 13.0–18.0)
Lymphocyte #: 1.4 10*3/uL (ref 1.0–3.6)
Lymphocyte %: 12 %
MCHC: 34 g/dL (ref 32.0–36.0)
MCV: 94 fL (ref 80–100)
Monocyte #: 0.6 x10 3/mm (ref 0.2–1.0)
Neutrophil %: 80.7 %
Platelet: 372 10*3/uL (ref 150–440)
RBC: 2.86 10*6/uL — ABNORMAL LOW (ref 4.40–5.90)
WBC: 11.7 10*3/uL — ABNORMAL HIGH (ref 3.8–10.6)

## 2013-01-23 LAB — APTT
Activated PTT: 53.2 secs — ABNORMAL HIGH (ref 23.6–35.9)
Activated PTT: 54.5 secs — ABNORMAL HIGH (ref 23.6–35.9)

## 2013-01-23 LAB — HEMOGLOBIN A1C: Hemoglobin A1C: 7 % — ABNORMAL HIGH (ref 4.2–6.3)

## 2013-01-24 LAB — APTT
Activated PTT: >160 secs (ref 23.6–35.9)
Activated PTT: 40 secs — ABNORMAL HIGH (ref 23.6–35.9)
Activated PTT: 58.4 secs — ABNORMAL HIGH (ref 23.6–35.9)

## 2013-01-24 LAB — CBC WITH DIFFERENTIAL/PLATELET
Basophil #: 0 10*3/uL (ref 0.0–0.1)
Basophil %: 0.5 %
Eosinophil #: 0.2 10*3/uL (ref 0.0–0.7)
Eosinophil %: 2.2 %
HCT: 25.2 % — ABNORMAL LOW (ref 40.0–52.0)
HGB: 8.4 g/dL — ABNORMAL LOW (ref 13.0–18.0)
Lymphocyte #: 1.4 10*3/uL (ref 1.0–3.6)
MCH: 31.8 pg (ref 26.0–34.0)
MCV: 96 fL (ref 80–100)
Monocyte #: 0.6 x10 3/mm (ref 0.2–1.0)
Monocyte %: 6.6 %
Neutrophil %: 75.5 %
RDW: 16.5 % — ABNORMAL HIGH (ref 11.5–14.5)

## 2013-01-24 LAB — BASIC METABOLIC PANEL
Calcium, Total: 8.5 mg/dL (ref 8.5–10.1)
Chloride: 103 mmol/L (ref 98–107)
EGFR (African American): 37 — ABNORMAL LOW
EGFR (Non-African Amer.): 32 — ABNORMAL LOW
Potassium: 3.4 mmol/L — ABNORMAL LOW (ref 3.5–5.1)

## 2013-01-24 LAB — CK: CK, Total: 1092 U/L — ABNORMAL HIGH (ref 35–232)

## 2013-01-25 LAB — APTT
Activated PTT: 42.7 secs — ABNORMAL HIGH (ref 23.6–35.9)
Activated PTT: 67.4 secs — ABNORMAL HIGH (ref 23.6–35.9)

## 2013-01-26 LAB — CBC WITH DIFFERENTIAL/PLATELET
Basophil #: 0 10*3/uL (ref 0.0–0.1)
Basophil %: 0.2 %
Eosinophil #: 0.1 10*3/uL (ref 0.0–0.7)
Eosinophil #: 0.1 10*3/uL (ref 0.0–0.7)
Eosinophil %: 1 %
Eosinophil %: 0.6 %
HCT: 23.3 % — ABNORMAL LOW (ref 40.0–52.0)
HGB: 7 g/dL — ABNORMAL LOW (ref 13.0–18.0)
Lymphocyte #: 1.3 10*3/uL (ref 1.0–3.6)
Lymphocyte #: 0.7 10*3/uL — ABNORMAL LOW (ref 1.0–3.6)
MCH: 32.3 pg (ref 26.0–34.0)
MCH: 32.3 pg (ref 26.0–34.0)
MCHC: 34.1 g/dL (ref 32.0–36.0)
MCV: 95 fL (ref 80–100)
MCV: 96 fL (ref 80–100)
Monocyte #: 0.6 x10 3/mm (ref 0.2–1.0)
Monocyte %: 5.4 %
Monocyte %: 5.5 %
Neutrophil #: 9.3 10*3/uL — ABNORMAL HIGH (ref 1.4–6.5)
Neutrophil %: 82.1 %
Neutrophil %: 86.4 %
Platelet: 285 10*3/uL (ref 150–440)
RBC: 2.47 10*6/uL — ABNORMAL LOW (ref 4.40–5.90)
RDW: 16.6 % — ABNORMAL HIGH (ref 11.5–14.5)
RDW: 16.9 % — ABNORMAL HIGH (ref 11.5–14.5)
WBC: 11.4 10*3/uL — ABNORMAL HIGH (ref 3.8–10.6)
WBC: 10.3 10*3/uL (ref 3.8–10.6)

## 2013-01-26 LAB — BASIC METABOLIC PANEL
Anion Gap: 7 (ref 7–16)
BUN: 39 mg/dL — ABNORMAL HIGH (ref 7–18)
Calcium, Total: 8.3 mg/dL — ABNORMAL LOW (ref 8.5–10.1)
Co2: 23 mmol/L (ref 21–32)
EGFR (African American): 51 — ABNORMAL LOW
EGFR (Non-African Amer.): 44 — ABNORMAL LOW
Sodium: 143 mmol/L (ref 136–145)

## 2013-01-26 LAB — HEMOGLOBIN: HGB: 8 g/dL — ABNORMAL LOW (ref 13.0–18.0)

## 2013-01-27 LAB — CBC WITH DIFFERENTIAL/PLATELET
Basophil #: 0 10*3/uL (ref 0.0–0.1)
Basophil %: 0.4 %
Eosinophil #: 0.2 10*3/uL (ref 0.0–0.7)
HGB: 9.1 g/dL — ABNORMAL LOW (ref 13.0–18.0)
MCH: 32.5 pg (ref 26.0–34.0)
MCV: 93 fL (ref 80–100)
Monocyte #: 0.6 x10 3/mm (ref 0.2–1.0)
Neutrophil #: 6.2 10*3/uL (ref 1.4–6.5)

## 2013-01-27 LAB — BASIC METABOLIC PANEL
Anion Gap: 8 (ref 7–16)
BUN: 36 mg/dL — ABNORMAL HIGH (ref 7–18)
Calcium, Total: 8.3 mg/dL — ABNORMAL LOW (ref 8.5–10.1)
Chloride: 112 mmol/L — ABNORMAL HIGH (ref 98–107)
Co2: 23 mmol/L (ref 21–32)
EGFR (African American): 46 — ABNORMAL LOW
Potassium: 3.7 mmol/L (ref 3.5–5.1)
Sodium: 143 mmol/L (ref 136–145)

## 2013-01-28 LAB — CULTURE, BLOOD (SINGLE)

## 2013-01-28 LAB — CBC WITH DIFFERENTIAL/PLATELET
Basophil #: 0 10*3/uL (ref 0.0–0.1)
Eosinophil #: 0.3 10*3/uL (ref 0.0–0.7)
Eosinophil %: 4.2 %
HCT: 26.6 % — ABNORMAL LOW (ref 40.0–52.0)
Lymphocyte %: 13.3 %
MCHC: 33.8 g/dL (ref 32.0–36.0)
MCV: 95 fL (ref 80–100)
Monocyte #: 0.5 x10 3/mm (ref 0.2–1.0)
Monocyte %: 5.8 %
Neutrophil #: 6 10*3/uL (ref 1.4–6.5)
Platelet: 293 10*3/uL (ref 150–440)
RBC: 2.8 10*6/uL — ABNORMAL LOW (ref 4.40–5.90)
RDW: 17.1 % — ABNORMAL HIGH (ref 11.5–14.5)
WBC: 7.9 10*3/uL (ref 3.8–10.6)

## 2013-01-28 LAB — BASIC METABOLIC PANEL
Anion Gap: 6 — ABNORMAL LOW (ref 7–16)
Calcium, Total: 8.3 mg/dL — ABNORMAL LOW (ref 8.5–10.1)
Co2: 23 mmol/L (ref 21–32)
Creatinine: 1.59 mg/dL — ABNORMAL HIGH (ref 0.60–1.30)
EGFR (Non-African Amer.): 41 — ABNORMAL LOW
Glucose: 136 mg/dL — ABNORMAL HIGH (ref 65–99)
Osmolality: 299 (ref 275–301)
Potassium: 3.8 mmol/L (ref 3.5–5.1)

## 2013-01-29 LAB — BASIC METABOLIC PANEL
Anion Gap: 6 — ABNORMAL LOW (ref 7–16)
BUN: 35 mg/dL — ABNORMAL HIGH (ref 7–18)
Chloride: 116 mmol/L — ABNORMAL HIGH (ref 98–107)
Co2: 22 mmol/L (ref 21–32)
Creatinine: 1.52 mg/dL — ABNORMAL HIGH (ref 0.60–1.30)
Osmolality: 300 (ref 275–301)
Potassium: 4 mmol/L (ref 3.5–5.1)

## 2013-01-29 LAB — CBC WITH DIFFERENTIAL/PLATELET
Basophil #: 0.1 10*3/uL (ref 0.0–0.1)
Basophil %: 0.8 %
Eosinophil #: 0.3 10*3/uL (ref 0.0–0.7)
Eosinophil %: 4.4 %
HGB: 8.8 g/dL — ABNORMAL LOW (ref 13.0–18.0)
Lymphocyte #: 1.1 10*3/uL (ref 1.0–3.6)
Lymphocyte %: 15.2 %
Monocyte %: 6.1 %
Neutrophil #: 5.4 10*3/uL (ref 1.4–6.5)
Neutrophil %: 73.5 %
Platelet: 278 10*3/uL (ref 150–440)
WBC: 7.4 10*3/uL (ref 3.8–10.6)

## 2013-01-30 LAB — PATHOLOGY REPORT

## 2013-02-04 ENCOUNTER — Ambulatory Visit: Payer: Self-pay | Admitting: Internal Medicine

## 2013-03-03 ENCOUNTER — Other Ambulatory Visit: Payer: Self-pay | Admitting: Family Medicine

## 2013-03-03 LAB — URINALYSIS, COMPLETE
Bacteria: NONE SEEN
Bilirubin,UR: NEGATIVE
Blood: NEGATIVE
Nitrite: NEGATIVE
Ph: 6 (ref 4.5–8.0)
Specific Gravity: 1.018 (ref 1.003–1.030)
Squamous Epithelial: NONE SEEN

## 2013-06-11 ENCOUNTER — Emergency Department: Payer: Self-pay | Admitting: Emergency Medicine

## 2013-07-14 LAB — COMPREHENSIVE METABOLIC PANEL
Albumin: 2.4 g/dL — ABNORMAL LOW (ref 3.4–5.0)
Alkaline Phosphatase: 95 U/L (ref 50–136)
Anion Gap: 5 — ABNORMAL LOW (ref 7–16)
BUN: 26 mg/dL — ABNORMAL HIGH (ref 7–18)
Bilirubin,Total: 0.3 mg/dL (ref 0.2–1.0)
Calcium, Total: 8.8 mg/dL (ref 8.5–10.1)
Co2: 25 mmol/L (ref 21–32)
Creatinine: 1.27 mg/dL (ref 0.60–1.30)
EGFR (African American): >60
EGFR (Non-African Amer.): 53 — ABNORMAL LOW
Osmolality: 273 (ref 275–301)
Potassium: 4.6 mmol/L (ref 3.5–5.1)
SGOT(AST): 30 U/L (ref 15–37)
SGPT (ALT): 14 U/L (ref 12–78)

## 2013-07-14 LAB — URINALYSIS, COMPLETE
Leukocyte Esterase: NEGATIVE
Nitrite: NEGATIVE
Ph: 5 (ref 4.5–8.0)
RBC,UR: 2 /HPF (ref 0–5)
Specific Gravity: 1.013 (ref 1.003–1.030)
Squamous Epithelial: 6
WBC UR: 1 /HPF (ref 0–5)

## 2013-07-14 LAB — CBC
MCV: 97 fL (ref 80–100)
RBC: 3.06 10*6/uL — ABNORMAL LOW (ref 4.40–5.90)
RDW: 14.9 % — ABNORMAL HIGH (ref 11.5–14.5)
WBC: 8.1 10*3/uL (ref 3.8–10.6)

## 2013-07-14 LAB — TSH: Thyroid Stimulating Horm: 6.85 u[IU]/mL — ABNORMAL HIGH

## 2013-07-14 LAB — VALPROIC ACID LEVEL: Valproic Acid: 14 ug/mL — ABNORMAL LOW

## 2013-07-15 LAB — CBC WITH DIFFERENTIAL/PLATELET
Basophil #: 0 10*3/uL (ref 0.0–0.1)
Basophil %: 0.7 %
HCT: 26.8 % — ABNORMAL LOW (ref 40.0–52.0)
HGB: 9.2 g/dL — ABNORMAL LOW (ref 13.0–18.0)
Lymphocyte #: 1.6 10*3/uL (ref 1.0–3.6)
MCH: 33.2 pg (ref 26.0–34.0)
MCHC: 34.4 g/dL (ref 32.0–36.0)
Monocyte %: 8 %
Platelet: 231 10*3/uL (ref 150–440)
RDW: 14.5 % (ref 11.5–14.5)
WBC: 6.3 10*3/uL (ref 3.8–10.6)

## 2013-07-15 LAB — BASIC METABOLIC PANEL
Anion Gap: 7 (ref 7–16)
BUN: 24 mg/dL — ABNORMAL HIGH (ref 7–18)
Calcium, Total: 8.5 mg/dL (ref 8.5–10.1)
Chloride: 108 mmol/L — ABNORMAL HIGH (ref 98–107)
Co2: 22 mmol/L (ref 21–32)
EGFR (African American): >60
EGFR (Non-African Amer.): >60
Osmolality: 278 (ref 275–301)
Sodium: 137 mmol/L (ref 136–145)

## 2013-07-16 LAB — URINE CULTURE

## 2013-07-16 LAB — PRO B NATRIURETIC PEPTIDE: B-Type Natriuretic Peptide: 10920 pg/mL — ABNORMAL HIGH (ref 0–450)

## 2013-07-16 LAB — T4, FREE: Free Thyroxine: 1.29 ng/dL (ref 0.76–1.46)

## 2013-07-16 LAB — MAGNESIUM: Magnesium: 1.1 mg/dL — ABNORMAL LOW

## 2013-07-17 ENCOUNTER — Inpatient Hospital Stay: Payer: Self-pay | Admitting: Internal Medicine

## 2013-09-11 ENCOUNTER — Encounter: Payer: Self-pay | Admitting: Cardiothoracic Surgery

## 2013-09-18 ENCOUNTER — Emergency Department: Payer: Self-pay | Admitting: Emergency Medicine

## 2013-09-18 ENCOUNTER — Telehealth: Payer: Self-pay | Admitting: Family Medicine

## 2013-09-18 LAB — BASIC METABOLIC PANEL
ANION GAP: 7 (ref 7–16)
BUN: 24 mg/dL — AB (ref 7–18)
CALCIUM: 8.7 mg/dL (ref 8.5–10.1)
CREATININE: 1.09 mg/dL (ref 0.60–1.30)
Chloride: 101 mmol/L (ref 98–107)
Co2: 26 mmol/L (ref 21–32)
EGFR (African American): >60
EGFR (Non-African Amer.): >60
Glucose: 119 mg/dL — ABNORMAL HIGH (ref 65–99)
OSMOLALITY: 273 (ref 275–301)
POTASSIUM: 5.1 mmol/L (ref 3.5–5.1)
SODIUM: 134 mmol/L — AB (ref 136–145)

## 2013-09-18 LAB — CBC
HCT: 29.5 % — ABNORMAL LOW (ref 40.0–52.0)
HGB: 9.8 g/dL — AB (ref 13.0–18.0)
MCH: 32.2 pg (ref 26.0–34.0)
MCHC: 33.1 g/dL (ref 32.0–36.0)
MCV: 97 fL (ref 80–100)
PLATELETS: 248 10*3/uL (ref 150–440)
RBC: 3.04 10*6/uL — AB (ref 4.40–5.90)
RDW: 16.9 % — ABNORMAL HIGH (ref 11.5–14.5)
WBC: 8.7 10*3/uL (ref 3.8–10.6)

## 2013-09-18 LAB — PROTIME-INR
INR: 1.1
Prothrombin Time: 14.4 secs (ref 11.5–14.7)

## 2013-09-18 NOTE — Telephone Encounter (Signed)
Received call from EDP in [place] for pt with noted bilateral parietal frontal lobe hematomas s/p fall in setting of being on ASA and plavix ( full medical records separate from [facility]).  Neurosurgery was contacted ( Dr. Dutch QuintPoole) with recommendation for observation and repeat CT scan in 24 hours. Hold ASA and plavix. Otherwise likely non surgical management. Will be admitted to SDU with observation. Consult neurosurgery for formal recs on arrival.

## 2013-09-19 ENCOUNTER — Encounter (HOSPITAL_COMMUNITY): Payer: Self-pay

## 2013-09-19 ENCOUNTER — Inpatient Hospital Stay (HOSPITAL_COMMUNITY)
Admission: AD | Admit: 2013-09-19 | Discharge: 2013-09-20 | DRG: 085 | Disposition: A | Discharge status: Home / Self Care | Payer: Medicare Other | Source: Other Acute Inpatient Hospital | Attending: Internal Medicine | Admitting: Internal Medicine

## 2013-09-19 ENCOUNTER — Inpatient Hospital Stay (HOSPITAL_COMMUNITY): Payer: Medicare Other

## 2013-09-19 DIAGNOSIS — I6200 Nontraumatic subdural hemorrhage, unspecified: Secondary | ICD-10-CM

## 2013-09-19 DIAGNOSIS — Y9229 Other specified public building as the place of occurrence of the external cause: Secondary | ICD-10-CM

## 2013-09-19 DIAGNOSIS — Z7901 Long term (current) use of anticoagulants: Secondary | ICD-10-CM

## 2013-09-19 DIAGNOSIS — IMO0002 Reserved for concepts with insufficient information to code with codable children: Secondary | ICD-10-CM | POA: Diagnosis present

## 2013-09-19 DIAGNOSIS — W19XXXA Unspecified fall, initial encounter: Secondary | ICD-10-CM | POA: Diagnosis present

## 2013-09-19 DIAGNOSIS — E119 Type 2 diabetes mellitus without complications: Secondary | ICD-10-CM | POA: Diagnosis present

## 2013-09-19 DIAGNOSIS — S065XAA Traumatic subdural hemorrhage with loss of consciousness status unknown, initial encounter: Secondary | ICD-10-CM | POA: Diagnosis present

## 2013-09-19 DIAGNOSIS — I959 Hypotension, unspecified: Secondary | ICD-10-CM | POA: Diagnosis present

## 2013-09-19 DIAGNOSIS — E43 Unspecified severe protein-calorie malnutrition: Secondary | ICD-10-CM | POA: Diagnosis present

## 2013-09-19 DIAGNOSIS — W050XXA Fall from non-moving wheelchair, initial encounter: Secondary | ICD-10-CM | POA: Diagnosis present

## 2013-09-19 DIAGNOSIS — I2510 Atherosclerotic heart disease of native coronary artery without angina pectoris: Secondary | ICD-10-CM | POA: Diagnosis present

## 2013-09-19 DIAGNOSIS — I739 Peripheral vascular disease, unspecified: Secondary | ICD-10-CM | POA: Diagnosis present

## 2013-09-19 DIAGNOSIS — S065X9A Traumatic subdural hemorrhage with loss of consciousness of unspecified duration, initial encounter: Secondary | ICD-10-CM | POA: Diagnosis present

## 2013-09-19 DIAGNOSIS — I10 Essential (primary) hypertension: Secondary | ICD-10-CM | POA: Diagnosis present

## 2013-09-19 HISTORY — DX: Acquired absence of unspecified leg below knee: Z89.519

## 2013-09-19 HISTORY — DX: Type 2 diabetes mellitus without complications: E11.9

## 2013-09-19 HISTORY — DX: Essential (primary) hypertension: I10

## 2013-09-19 HISTORY — DX: Anemia, unspecified: D64.9

## 2013-09-19 HISTORY — DX: Peripheral vascular disease, unspecified: I73.9

## 2013-09-19 HISTORY — DX: Acute myocardial infarction, unspecified: I21.9

## 2013-09-19 HISTORY — DX: Benign prostatic hyperplasia without lower urinary tract symptoms: N40.0

## 2013-09-19 HISTORY — DX: Major depressive disorder, single episode, unspecified: F32.9

## 2013-09-19 HISTORY — DX: Transient cerebral ischemic attack, unspecified: G45.9

## 2013-09-19 HISTORY — DX: Aphasia: R47.01

## 2013-09-19 HISTORY — DX: Atherosclerotic heart disease of native coronary artery without angina pectoris: I25.10

## 2013-09-19 HISTORY — DX: Age-related osteoporosis without current pathological fracture: M81.0

## 2013-09-19 HISTORY — DX: Pressure ulcer of unspecified site, unspecified stage: L89.90

## 2013-09-19 HISTORY — DX: Unspecified osteoarthritis, unspecified site: M19.90

## 2013-09-19 HISTORY — DX: Acquired absence of unspecified leg above knee: Z89.619

## 2013-09-19 HISTORY — DX: Adult failure to thrive: R62.7

## 2013-09-19 HISTORY — DX: Hyperlipidemia, unspecified: E78.5

## 2013-09-19 HISTORY — DX: Presence of aortocoronary bypass graft: Z95.1

## 2013-09-19 HISTORY — DX: Gastro-esophageal reflux disease without esophagitis: K21.9

## 2013-09-19 HISTORY — DX: Depression, unspecified: F32.A

## 2013-09-19 LAB — GLUCOSE, CAPILLARY
GLUCOSE-CAPILLARY: 108 mg/dL — AB (ref 70–99)
Glucose-Capillary: 121 mg/dL — ABNORMAL HIGH (ref 70–99)
Glucose-Capillary: 150 mg/dL — ABNORMAL HIGH (ref 70–99)
Glucose-Capillary: 144 mg/dL — ABNORMAL HIGH (ref 70–99)
Glucose-Capillary: 132 mg/dL — ABNORMAL HIGH (ref 70–99)

## 2013-09-19 LAB — BASIC METABOLIC PANEL
BUN: 26 mg/dL — ABNORMAL HIGH (ref 6–23)
CALCIUM: 8.7 mg/dL (ref 8.4–10.5)
CO2: 21 meq/L (ref 19–32)
Chloride: 98 mEq/L (ref 96–112)
Creatinine, Ser: 1.19 mg/dL (ref 0.50–1.35)
GFR calc Af Amer: 65 mL/min — ABNORMAL LOW (ref >90)
GFR, EST NON AFRICAN AMERICAN: 56 mL/min — AB (ref >90)
GLUCOSE: 92 mg/dL (ref 70–99)
POTASSIUM: 4.2 meq/L (ref 3.7–5.3)
SODIUM: 135 meq/L — AB (ref 137–147)

## 2013-09-19 LAB — CBC
HEMATOCRIT: 26.1 % — AB (ref 39.0–52.0)
HEMOGLOBIN: 8.8 g/dL — AB (ref 13.0–17.0)
MCH: 32.4 pg (ref 26.0–34.0)
MCHC: 33.7 g/dL (ref 30.0–36.0)
MCV: 96 fL (ref 78.0–100.0)
PLATELETS: 241 10*3/uL (ref 150–400)
RBC: 2.72 MIL/uL — ABNORMAL LOW (ref 4.22–5.81)
RDW: 16.5 % — ABNORMAL HIGH (ref 11.5–15.5)
WBC: 7.3 10*3/uL (ref 4.0–10.5)

## 2013-09-19 LAB — PROTIME-INR
INR: 1.11 (ref 0.00–1.49)
PROTHROMBIN TIME: 14.1 s (ref 11.6–15.2)

## 2013-09-19 LAB — MRSA PCR SCREENING: MRSA by PCR: POSITIVE — AB

## 2013-09-19 MED ORDER — INSULIN ASPART 100 UNIT/ML ~~LOC~~ SOLN: 0.0000 [IU] | Freq: Three times a day (TID) | SUBCUTANEOUS | Status: DC

## 2013-09-19 MED ORDER — TAMSULOSIN HCL 0.4 MG PO CAPS
0.4000 mg | ORAL_CAPSULE | Freq: Every day | ORAL | Status: DC
  Administered 2013-09-19 – 2013-09-20 (×2): 0.4 mg via ORAL
  Filled 2013-09-19 (×2): qty 1

## 2013-09-19 MED ORDER — CHLORHEXIDINE GLUCONATE CLOTH 2 % EX PADS
6.0000 | MEDICATED_PAD | Freq: Every day | CUTANEOUS | Status: DC
  Administered 2013-09-19 – 2013-09-20 (×2): 6 via TOPICAL

## 2013-09-19 MED ORDER — DIVALPROEX SODIUM ER 250 MG PO TB24
250.0000 mg | ORAL_TABLET | Freq: Every day | ORAL | Status: DC
  Administered 2013-09-19: 250 mg via ORAL
  Filled 2013-09-19 (×2): qty 1

## 2013-09-19 MED ORDER — SODIUM CHLORIDE 0.9 % IV SOLN: 250.0000 mL | INTRAVENOUS | Status: DC | PRN

## 2013-09-19 MED ORDER — SODIUM CHLORIDE 0.9 % IJ SOLN: 3.0000 mL | INTRAMUSCULAR | Status: DC | PRN

## 2013-09-19 MED ORDER — SIMVASTATIN 20 MG PO TABS
20.0000 mg | ORAL_TABLET | Freq: Every day | ORAL | Status: DC
  Administered 2013-09-19: 20 mg via ORAL
  Filled 2013-09-19 (×2): qty 1

## 2013-09-19 MED ORDER — HYDROCODONE-ACETAMINOPHEN 5-325 MG PO TABS
1.0000 | ORAL_TABLET | Freq: Four times a day (QID) | ORAL | Status: DC | PRN
  Administered 2013-09-19 (×2): 1 via ORAL
  Filled 2013-09-19 (×2): qty 1

## 2013-09-19 MED ORDER — MUPIROCIN 2 % EX OINT
1.0000 "application " | TOPICAL_OINTMENT | Freq: Two times a day (BID) | CUTANEOUS | Status: DC
  Administered 2013-09-19 – 2013-09-20 (×3): 1 via NASAL
  Filled 2013-09-19: qty 22

## 2013-09-19 MED ORDER — PAROXETINE HCL 10 MG PO TABS
10.0000 mg | ORAL_TABLET | Freq: Every day | ORAL | Status: DC
  Administered 2013-09-19: 10 mg via ORAL
  Filled 2013-09-19 (×2): qty 1

## 2013-09-19 MED ORDER — BIOTENE DRY MOUTH MT LIQD
15.0000 mL | Freq: Two times a day (BID) | OROMUCOSAL | Status: DC
  Administered 2013-09-19 – 2013-09-20 (×3): 15 mL via OROMUCOSAL

## 2013-09-19 MED ORDER — INSULIN ASPART 100 UNIT/ML ~~LOC~~ SOLN: 0.0000 [IU] | Freq: Every day | SUBCUTANEOUS | Status: DC

## 2013-09-19 MED ORDER — SODIUM CHLORIDE 0.9 % IJ SOLN
3.0000 mL | Freq: Two times a day (BID) | INTRAMUSCULAR | Status: DC
  Administered 2013-09-19 – 2013-09-20 (×3): 3 mL via INTRAVENOUS

## 2013-09-19 MED ORDER — GLUCERNA SHAKE PO LIQD: 237.0000 mL | ORAL | Status: DC

## 2013-09-19 MED ORDER — GABAPENTIN 300 MG PO CAPS
300.0000 mg | ORAL_CAPSULE | Freq: Two times a day (BID) | ORAL | Status: DC
  Administered 2013-09-19 – 2013-09-20 (×3): 300 mg via ORAL
  Filled 2013-09-19 (×4): qty 1

## 2013-09-19 MED ORDER — MEGESTROL ACETATE 40 MG/ML PO SUSP
800.0000 mg | Freq: Every day | ORAL | Status: DC
  Administered 2013-09-19: 800 mg via ORAL
  Filled 2013-09-19 (×2): qty 20

## 2013-09-19 NOTE — Progress Notes (Signed)
INITIAL NUTRITION ASSESSMENT  DOCUMENTATION CODES Per approved criteria  -Severe malnutrition in the context of acute illness or injury   INTERVENTION: Add Glucerna Shake po daily, each supplement provides 220 kcal and 10 grams of protein. Downgrade diet to Dysphagia 2 given pt's report of significant oral pain. RD to continue to follow nutrition care plan.  NUTRITION DIAGNOSIS: Inadequate oral intake related to poor appetite and oral pain as evidenced by pt report.   Goal: Intake to meet >90% of estimated nutrition needs.  Monitor:  weight trends, lab trends, I/O's, PO intake, supplement tolerance  Reason for Assessment: Malnutrition Screening Tool  80 y.o. male  Admitting Dx: Hematoma of brain  ASSESSMENT: PMHx significant for bilateral amputations, PVD, decubitus ulcer. Admitted s/p fall from wheelchair at doctor's office. Work-up reveals bilateral hematomas of parietal/frontal lobes.  Pt is currently admitted for observation, plan to undergo repeat CT today and discuss results with neurosrguery accordingly. Pt is likely not a surgical candidate at this time.  Pt reports that his gums have been hurting for about 1 month, which is also the time frame he tells me that he has started to eat very little. Will go entire days without eating. Pt reports that he doesn't think that his current weight of 111 lb is correct, as he stated that about 1 month at he was "150-something pounds." Repeats several times during our discussion that he needs to get his teeth fixed. Says that they bother him a lot. Agreeable to softer foods. Drinks Glucerna daily at home and is agreeable to receiving while here.  Nutrition Focused Physical Exam:  Subcutaneous Fat:  Orbital Region: WNL Upper Arm Region: moderate depletion Thoracic and Lumbar Region: n/a  Muscle:  Temple Region: WNL Clavicle Bone Region: moderate depletion Clavicle and Acromion Bone Region: moderate depletion Scapular Bone  Region: n/a Dorsal Hand: n/a Patellar Region: WNL Anterior Thigh Region: n/a Posterior Calf Region: n/a  Edema: none  Pt meets criteria for severe MALNUTRITION in the context of acute injury (oral) as evidenced by intake of <50% x at least 5 days and moderate wasting. ?accuracy of stated weight loss of 39 lb x 1 month.  Height: Ht Readings from Last 1 Encounters:  09/19/13 5' 5.5" (1.664 m)    Weight: Wt Readings from Last 1 Encounters:  09/19/13 111 lb 5.3 oz (50.5 kg)    Ideal Body Weight: 121 lb/55 kg  % Ideal Body Weight: 92%  Wt Readings from Last 10 Encounters:  09/19/13 111 lb 5.3 oz (50.5 kg)    Usual Body Weight: "150-something a month ago" per pt  % Usual Body Weight: 74%  BMI:  20.9 - normal weight - adjusted for bilateral BKA  Estimated Nutritional Needs: Kcal: 1500 - 1700 Protein: 65 - 75 g Fluid: 1.5 - 1.7 liters  Skin:  stage II pressure ulcer to sacrum Stage II pressure ulcer to scrotum  Diet Order: Carb Control  EDUCATION NEEDS: -No education needs identified at this time   Intake/Output Summary (Last 24 hours) at 09/19/13 1229 Last data filed at 09/19/13 0700  Gross per 24 hour  Intake    240 ml  Output      0 ml  Net    240 ml    Last BM: 1/14  Labs:   Recent Labs Lab 09/19/13 0350  NA 135*  K 4.2  CL 98  CO2 21  BUN 26*  CREATININE 1.19  CALCIUM 8.7  GLUCOSE 92    CBG (last 3)  Recent Labs  09/19/13 0102 09/19/13 0848  GLUCAP 121* 108*    Scheduled Meds: . antiseptic oral rinse  15 mL Mouth Rinse BID  . Chlorhexidine Gluconate Cloth  6 each Topical Q0600  . divalproex  250 mg Oral QHS  . gabapentin  300 mg Oral BID  . insulin aspart  0-5 Units Subcutaneous QHS  . insulin aspart  0-9 Units Subcutaneous TID WC  . megestrol  800 mg Oral Daily  . mupirocin ointment  1 application Nasal BID  . PARoxetine  10 mg Oral QHS  . simvastatin  20 mg Oral QHS  . sodium chloride  3 mL Intravenous Q12H  . tamsulosin   0.4 mg Oral Daily    Continuous Infusions:   No past medical history on file.  No past surgical history on file.  Nicholas MottoSamantha Lamoine Magallon MS, RD, LDN Pager: [phone] After-hours pager: [phone]

## 2013-09-19 NOTE — Progress Notes (Signed)
UR completed. Patient changed to inpatient r/t subdural hematoma

## 2013-09-19 NOTE — H&P (Signed)
  Chief Complaint:  fell  HPI: 80 yo male s/p bilateral amputee, pvd, decub ulcer was at his pcp office today, went to readjust himself in his wheelchair when he fell forward out of his wheelchair.  No loc.  Found to have bilateral hematomas parietal/frontal lobes with no shifting.  Pt was transferred here from [place].  He denies any pain right now.  Is basically refusing to answer most questions and is upset b/c i have woken him up.  No focal neurological deficits.  Review of Systems:  Positive and negative as per HPI otherwise all other systems are negative  Past Medical History: No past medical history on file. No past surgical history on file. Same as assesment list below  Medications: Prior to Admission medications   Not on File  Not done yet by pharm   Allergies:  he denies  Social History: Lives with wife and son  Family History: none  Physical Exam: Filed Vitals:   09/19/13 0100  BP: 91/42  Pulse: 64  Temp: 97.6 F (36.4 C)  TempSrc: Oral  Resp: 13  Height: 5' 5.5" (1.664 m)  Weight: 50.5 kg (111 lb 5.3 oz)  SpO2: 94%   General appearance: alert, no distress and uncooperative Head: Normocephalic, without obvious abnormality, scalp contusion Eyes: negative Nose: Nares normal. Septum midline. Mucosa normal. No drainage or sinus tenderness. Neck: no JVD and supple, symmetrical, trachea midline Lungs: clear to auscultation bilaterally Heart: regular rate and rhythm, S1, S2 normal, no murmur, click, rub or gallop Abdomen: soft, non-tender; bowel sounds normal; no masses,  no organomegaly Extremities: extremities normal, atraumatic, no cyanosis or edema  Bilateral amputee Pulses: 2+ and symmetric Skin: Skin color, texture, turgor normal. No rashes or lesions Neurologic: Grossly normal    Labs on Admission:   Reviewed [place] [facility]      Radiological Exams on Admission: No results found.  Assessment/Plan  80 yo male with fall on plavix/asa with IC  bleed  Principal Problem:   Hematoma of brain-  obs overnight for repeat ct head in 24 hours.  Neurosurgery has been consulted and recommended repeat cth.  freq neuro cks overnight.  Hold blood thinners.  Active Problems:   Fall   Long term (current) use of anticoagulants   PVD (peripheral vascular disease)   DM (diabetes mellitus)   HTN (hypertension)   Subdural hematoma    Pearlee Arvizu A 09/19/2013, 2:18 AM

## 2013-09-19 NOTE — Progress Notes (Signed)
Patient admitted this morning. H&P reviewed.  Patient feels well. Denies any headaches or nausea or vomiting.  VSS  Bruise noted right forehead. EOM intact. Pupils equal. Lungs are CTA bilaterally Heart: S1S2 normal regular. No S3S4. No rubs murmurs or bruits Neuro: No focal deficits.  Patient had a fall yesterday resulting in 2 small foci of hemorrhage: 9mm in left parietal lobe and 4.596mm in right parietal lobe consistent with contusions. [facility]. Will undergo repeat Ct today. Discussed with neurosurgery last night. Wll discuss with them again after repeat CT. Likely not surgical candidate at this time. Leave in SDU till CT is done. Hold antiplatelet agents.  Patent has history of CAD s/p CABG in past. He is also bilateral amputee. Ambulates with WC.  Anticipate discharge in Am if CT is stable and depending on Neurosurgery input.  Nicholas [name] 09/19/2013 10:31 AM

## 2013-09-20 ENCOUNTER — Encounter (HOSPITAL_COMMUNITY): Payer: Self-pay | Admitting: *Deleted

## 2013-09-20 LAB — BASIC METABOLIC PANEL
BUN: 24 mg/dL — ABNORMAL HIGH (ref 6–23)
CO2: 25 mEq/L (ref 19–32)
CREATININE: 0.97 mg/dL (ref 0.50–1.35)
Calcium: 8.5 mg/dL (ref 8.4–10.5)
Chloride: 95 mEq/L — ABNORMAL LOW (ref 96–112)
GFR calc Af Amer: 88 mL/min — ABNORMAL LOW (ref >90)
GFR, EST NON AFRICAN AMERICAN: 76 mL/min — AB (ref >90)
GLUCOSE: 111 mg/dL — AB (ref 70–99)
POTASSIUM: 4.3 meq/L (ref 3.7–5.3)
Sodium: 131 mEq/L — ABNORMAL LOW (ref 137–147)

## 2013-09-20 LAB — URINALYSIS, ROUTINE W REFLEX MICROSCOPIC
Bilirubin Urine: NEGATIVE
GLUCOSE, UA: NEGATIVE mg/dL
HGB URINE DIPSTICK: NEGATIVE
Ketones, ur: NEGATIVE mg/dL
Nitrite: NEGATIVE
PROTEIN: NEGATIVE mg/dL
Specific Gravity, Urine: 1.015 (ref 1.005–1.030)
Urobilinogen, UA: 0.2 mg/dL (ref 0.0–1.0)
pH: 5 (ref 5.0–8.0)

## 2013-09-20 LAB — CBC
HEMATOCRIT: 26.7 % — AB (ref 39.0–52.0)
Hemoglobin: 8.8 g/dL — ABNORMAL LOW (ref 13.0–17.0)
MCH: 32 pg (ref 26.0–34.0)
MCHC: 33 g/dL (ref 30.0–36.0)
MCV: 97.1 fL (ref 78.0–100.0)
Platelets: 204 10*3/uL (ref 150–400)
RBC: 2.75 MIL/uL — ABNORMAL LOW (ref 4.22–5.81)
RDW: 16.1 % — ABNORMAL HIGH (ref 11.5–15.5)
WBC: 6.2 10*3/uL (ref 4.0–10.5)

## 2013-09-20 LAB — GLUCOSE, CAPILLARY
GLUCOSE-CAPILLARY: 85 mg/dL (ref 70–99)
Glucose-Capillary: 133 mg/dL — ABNORMAL HIGH (ref 70–99)
Glucose-Capillary: 134 mg/dL — ABNORMAL HIGH (ref 70–99)

## 2013-09-20 LAB — URINE MICROSCOPIC-ADD ON

## 2013-09-20 MED ORDER — SODIUM CHLORIDE 0.9 % IV BOLUS (SEPSIS)
250.0000 mL | Freq: Once | INTRAVENOUS | Status: AC
  Administered 2013-09-20: 250 mL via INTRAVENOUS

## 2013-09-20 NOTE — Clinical Social Work Note (Signed)
CSW has requested next available ambulance transport for patient. DC packet prepared and given to patient's nurse. CSW signing off at this time.   Roddie McBryant Rushton Early, WaltersLCSWA, [place]ASA, [phone]

## 2013-09-20 NOTE — Progress Notes (Signed)
Patient being discharged home per MD order, EMS arranged per CSW and family notified of patients discharge.Patient dressed in paper scrubs.

## 2013-09-20 NOTE — Discharge Summary (Addendum)
[facility]  Physician Discharge Summary   Patient ID: Nicholas [name] MRN: [id number] DOB/AGE: 78/12/1930 78 y.o.  Admit date: 09/19/2013 Discharge date: 09/20/2013  PCP: Jerl MinaHEDRICK,JAMES, MD  DISCHARGE DIAGNOSES:  Principal Problem:   Hematoma of brain Active Problems:   Fall   PVD (peripheral vascular disease)   DM (diabetes mellitus)   HTN (hypertension)   Subdural hematoma   RECOMMENDATIONS FOR OUTPATIENT FOLLOW UP: 1. Plavix and Aspirin held for 1 week per neurosurgery recommendations. Can resume 09/26/13. 2. Metoprolol held for low blood pressure. Please address at followup. 3. Glipizide to be held due to borderline low CBG's Please address at follow up.  DISCHARGE CONDITION: fair  Diet recommendation: Heart Healthy  Filed Weights   09/19/13 0100  Weight: 50.5 kg (111 lb 5.3 oz)    INITIAL HISTORY: 80 yo male s/p bilateral amputee, pvd, decub ulcer, CAD who was at his pcp office and went to readjust himself in his wheelchair when he fell forward out of his wheelchair. No loc. Found to have bilateral hematomas parietal/frontal lobes with no shifting. [facility] from [place].   Consultations:  Phone discussion with Dr. Jordan LikesPool with Neurosurgery  Procedures: None  HOSPITAL COURSE:   Bilateral Parietal Lobe Hemorrhage Patient underwent CT at [place] which showed 2 small foci of hemorrhage: 9mm in left parietal lobe and 4.606mm in right parietal lobe consistent with contusions. [facility]. He underwent repeat CT on 1/14. This revealed a 8 mm area in left temporal region. Discussed with Dr. Jordan LikesPool with neurosurgery. Patient is asymptomatic with no deficits. Dr. Jordan LikesPool recommends no further evaluation but for patient to stay off of Plavix and Aspirin for 1 week. This has been discussed with patient.  He does have history of CAD and PVD and has bilateral amputation. These issues remains stable. His BP was on the  lower side last night and this morning. He has been off of his beta blocker. Will recommend close follow up with his PCP. Hold metoprolol till them. NS 250ml to be given today as he did not have good input yesterday. Patient remains asymptomatic.   He was also experiencing some difficulty urinating this morning. Just before In and out catheterization was to be performed he urinated on own. UA doesn't show infection. Continue Flomax. last night. Wll discuss with them again after repeat CT.   Sodium noted to be low today possibly from mild dehydration. Should improve with IVF bolus.  CBG's running borderline low as well. Possibly from poor oral intake. Will ask him to hold his Glipizide as well till PCP follow up.  Severe protein calorie malnutrition also noted. Patient to improve oral intake.  Overall patient remains stable. If BP remains stable he can be discharged later today.    PERTINENT LABS:  The results of significant diagnostics from this hospitalization (including imaging, microbiology, ancillary and laboratory) are listed below for reference.    Microbiology: Recent Results (from the past 240 hour(s))  MRSA PCR SCREENING     Status: Abnormal   Collection Time    09/19/13  1:07 AM      Result Value Range Status   MRSA by PCR POSITIVE (*) NEGATIVE Final   Comment:            The GeneXpert MRSA Assay (FDA     approved for NASAL specimens     only), is one component of a     comprehensive MRSA colonization  surveillance program. It is not     intended to diagnose MRSA     infection nor to guide or     monitor treatment for     MRSA infections.     RESULT CALLED TO, READ BACK BY AND VERIFIED WITH:     B.MUSIAL,RN 0355 09/19/13 M.CAMPBELL     Labs: Basic Metabolic Panel:  Recent Labs Lab 09/19/13 0350 09/20/13 0240  NA 135* 131*  K 4.2 4.3  CL 98 95*  CO2 21 25  GLUCOSE 92 111*  BUN 26* 24*  CREATININE 1.19 0.97  CALCIUM 8.7 8.5   CBC:  Recent Labs Lab  09/19/13 0350 09/20/13 0240  WBC 7.3 6.2  HGB 8.8* 8.8*  HCT 26.1* 26.7*  MCV 96.0 97.1  PLT 241 204   CBG:  Recent Labs Lab 09/19/13 0848 09/19/13 1254 09/19/13 1613 09/19/13 2130 09/20/13 0828  GLUCAP 108* 150* 144* 132* 85     IMAGING STUDIES Ct Head Wo Contrast  09/19/2013   CLINICAL DATA:  Fall with intracranial hemorrhage.  EXAM: CT HEAD WITHOUT CONTRAST  TECHNIQUE: Contiguous axial images were obtained from the base of the skull through the vertex without intravenous contrast.  COMPARISON:  None.  FINDINGS: There is an 8 mm focus of hemorrhage in the left posterior temporal region that could be blood within a sulcus or small amount of hemorrhage within the surface of the brain. No other intracranial blood is visible. The brain shows generalized atrophy with extensive chronic small vessel changes throughout the white matter. No sign of acute infarction, mass lesion, hydrocephalus or extra-axial collection. No skull fracture. There are inflammatory changes of the paranasal sinuses most notable in the left frontal, the ethmoid in the right sphenoid regions. Some fluid is present in the mastoid air cells on the right and in the right middle ear.  IMPRESSION: 8 mm hemorrhage in the left posterior temporal region. I think this could either be within a sulcus or affecting the gyral surface of the brain. No mass effect or edema. No other intracranial hemorrhage.   Electronically Signed   By: Paulina Fusi M.D.   On: 09/19/2013 14:58    DISCHARGE EXAMINATION: Filed Vitals:   09/20/13 0000 09/20/13 0400 09/20/13 0800 09/20/13 0830  BP: 124/60 102/54 96/64 84/35   Pulse: 71 72  79  Temp: 97.3 F (36.3 C) 97.6 F (36.4 C)  98.5 F (36.9 C)  TempSrc: Oral Oral  Oral  Resp: 17 13  12   Height:      Weight:      SpO2: 98% 97%  99%   General appearance: alert, cooperative, appears stated age and no distress Resp: clear to auscultation bilaterally Cardio: regular rate and rhythm, S1,  S2 normal, no murmur, click, rub or gallop GI: soft, non-tender; bowel sounds normal; no masses,  no organomegaly Neurologic: No focal deficits Bilateral amputee  DISPOSITION: Home via EMS  Discharge Orders   Future Orders Complete By Expires   Diet - low sodium heart healthy  As directed    Discharge instructions  As directed    Comments:     Resume Aspirin and Plavix on 09/26/13. See your doctor before resuming Metoprolol. Seek attention if you start having headaches, get lightheaded or feel weak.   Increase activity slowly  As directed       ALLERGIES: No Known Allergies  Current Discharge Medication List    CONTINUE these medications which have NOT CHANGED   Details  cholecalciferol (VITAMIN D)  1000 UNITS tablet Take 1,000 Units by mouth 2 (two) times daily.    divalproex (DEPAKOTE ER) 250 MG 24 hr tablet Take 250 mg by mouth at bedtime.    gabapentin (NEURONTIN) 300 MG capsule Take 300 mg by mouth 2 (two) times daily.    HYDROcodone-acetaminophen (NORCO/VICODIN) 5-325 MG per tablet Take 1 tablet by mouth every 6 (six) hours as needed for moderate pain.    loperamide (IMODIUM) 2 MG capsule Take 2 mg by mouth daily as needed for diarrhea or loose stools.    loratadine (CLARITIN) 10 MG tablet Take 10 mg by mouth daily as needed for allergies.    megestrol (MEGACE) 40 MG/ML suspension Take 800 mg by mouth daily.    Omega-3 Fatty Acids (FISH OIL) 1200 MG CAPS Take 1,200 mg by mouth 2 (two) times daily.    PARoxetine (PAXIL) 10 MG tablet Take 10 mg by mouth at bedtime.    simvastatin (ZOCOR) 40 MG tablet Take 20 mg by mouth at bedtime.    tamsulosin (FLOMAX) 0.4 MG CAPS capsule Take 0.4 mg by mouth daily.      STOP taking these medications     aspirin EC 81 MG tablet      clopidogrel (PLAVIX) 75 MG tablet      glipiZIDE (GLUCOTROL) 5 MG tablet      metoprolol (LOPRESSOR) 50 MG tablet        Follow-up Information   Follow up with HEDRICK,JAMES, MD. Schedule an  appointment as soon as possible for a visit in 5 days. (talk about resuming metoprolol (at different dose?) which was held for low blood pressure)    Specialty:  Family Medicine   Contact information:   [address]   [place] [address] [phone]       Call Julio Sicks A, MD. (As needed for further problems with brain hemorrhage)    Specialty:  Neurosurgery   Contact information:   [address] [place] [address] [phone]       TOTAL DISCHARGE TIME: 35 mins  [facility]  [facility] Pager [phone]  09/20/2013, 9:27 AM

## 2013-09-20 NOTE — Clinical Documentation Improvement (Signed)
  Evaluation by Registered Dietician 1/14 states "Severe malnutrition in the context of acute illness or injury". If you agree please add to DC summary to illustrate severity of illness and risk of mortality. Thank you.  Possible Clinical Conditions? - Severe protein calorie malnutrtion  - Other Condition (please specify)  Supporting Information: - Muscle: Clavicle Bone Region: moderate depletion  Clavicle and Acromion Bone Region: moderate depletion - Subcutaneous: Upper Arm Region: moderate depletion   Thank You, Beverley FiedlerLaurie E Zacariah Belue ,RN Clinical Documentation Specialist:  [phone]  [facility]- Health Information Management

## 2013-09-20 NOTE — Progress Notes (Signed)
Upon starting to In & Out cath patient with Night RN, patient stated he had to void, patient voided 275 on his own, sent down UA. Will continue to monitor patient.

## 2013-10-07 ENCOUNTER — Encounter: Payer: Self-pay | Admitting: Cardiothoracic Surgery

## 2013-11-09 ENCOUNTER — Emergency Department: Payer: Self-pay | Admitting: Emergency Medicine

## 2013-11-09 LAB — CBC
HCT: 30.4 % — AB (ref 40.0–52.0)
HGB: 10.2 g/dL — ABNORMAL LOW (ref 13.0–18.0)
MCH: 34 pg (ref 26.0–34.0)
MCHC: 33.7 g/dL (ref 32.0–36.0)
MCV: 101 fL — ABNORMAL HIGH (ref 80–100)
PLATELETS: 240 10*3/uL (ref 150–440)
RBC: 3.01 10*6/uL — AB (ref 4.40–5.90)
RDW: 16.1 % — AB (ref 11.5–14.5)
WBC: 6.3 10*3/uL (ref 3.8–10.6)

## 2013-11-09 LAB — COMPREHENSIVE METABOLIC PANEL
ALBUMIN: 2.3 g/dL — AB (ref 3.4–5.0)
ALT: 11 U/L — AB (ref 12–78)
AST: 31 U/L (ref 15–37)
Alkaline Phosphatase: 99 U/L
Anion Gap: 8 (ref 7–16)
BUN: 18 mg/dL (ref 7–18)
Bilirubin,Total: 0.5 mg/dL (ref 0.2–1.0)
CALCIUM: 8.4 mg/dL — AB (ref 8.5–10.1)
CO2: 26 mmol/L (ref 21–32)
Chloride: 100 mmol/L (ref 98–107)
Creatinine: 1.12 mg/dL (ref 0.60–1.30)
EGFR (African American): >60
EGFR (Non-African Amer.): >60
Glucose: 83 mg/dL (ref 65–99)
Osmolality: 269 (ref 275–301)
POTASSIUM: 4.1 mmol/L (ref 3.5–5.1)
Sodium: 134 mmol/L — ABNORMAL LOW (ref 136–145)
Total Protein: 7.5 g/dL (ref 6.4–8.2)

## 2013-11-09 LAB — URINALYSIS, COMPLETE
BILIRUBIN, UR: NEGATIVE
GLUCOSE, UR: NEGATIVE mg/dL (ref 0–75)
Nitrite: NEGATIVE
PROTEIN: NEGATIVE
Ph: 5 (ref 4.5–8.0)
RBC,UR: 13 /HPF (ref 0–5)
SQUAMOUS EPITHELIAL: NONE SEEN
Specific Gravity: 1.011 (ref 1.003–1.030)
WBC UR: 32 /HPF (ref 0–5)

## 2013-11-09 LAB — TROPONIN I

## 2013-11-09 LAB — VALPROIC ACID LEVEL: Valproic Acid: 6 ug/mL — ABNORMAL LOW

## 2013-11-09 LAB — LIPASE, BLOOD: Lipase: 72 U/L — ABNORMAL LOW (ref 73–393)

## 2013-11-21 ENCOUNTER — Observation Stay: Payer: Self-pay | Admitting: Internal Medicine

## 2013-11-21 LAB — CBC
HCT: 23.6 % — AB (ref 40.0–52.0)
HGB: 8.1 g/dL — AB (ref 13.0–18.0)
MCH: 34.3 pg — ABNORMAL HIGH (ref 26.0–34.0)
MCHC: 34.1 g/dL (ref 32.0–36.0)
MCV: 101 fL — ABNORMAL HIGH (ref 80–100)
Platelet: 201 10*3/uL (ref 150–440)
RBC: 2.35 10*6/uL — ABNORMAL LOW (ref 4.40–5.90)
RDW: 15.7 % — ABNORMAL HIGH (ref 11.5–14.5)
WBC: 6.7 10*3/uL (ref 3.8–10.6)

## 2013-11-21 LAB — URINALYSIS, COMPLETE
Bilirubin,UR: NEGATIVE
Glucose,UR: NEGATIVE mg/dL (ref 0–75)
KETONE: NEGATIVE
NITRITE: NEGATIVE
PH: 5 (ref 4.5–8.0)
PROTEIN: NEGATIVE
RBC,UR: 18 /HPF (ref 0–5)
Specific Gravity: 1.014 (ref 1.003–1.030)
Squamous Epithelial: <1
Transitional Epi: <1

## 2013-11-21 LAB — COMPREHENSIVE METABOLIC PANEL
AST: 19 U/L (ref 15–37)
Albumin: 1.7 g/dL — ABNORMAL LOW (ref 3.4–5.0)
Alkaline Phosphatase: 66 U/L
Anion Gap: 3 — ABNORMAL LOW (ref 7–16)
BUN: 19 mg/dL — ABNORMAL HIGH (ref 7–18)
Bilirubin,Total: 0.2 mg/dL (ref 0.2–1.0)
Calcium, Total: 7.3 mg/dL — ABNORMAL LOW (ref 8.5–10.1)
Chloride: 105 mmol/L (ref 98–107)
Co2: 26 mmol/L (ref 21–32)
Creatinine: 1.17 mg/dL (ref 0.60–1.30)
EGFR (Non-African Amer.): 59 — ABNORMAL LOW
Glucose: 78 mg/dL (ref 65–99)
Osmolality: 269 (ref 275–301)
POTASSIUM: 4 mmol/L (ref 3.5–5.1)
SGPT (ALT): 9 U/L — ABNORMAL LOW (ref 12–78)
Sodium: 134 mmol/L — ABNORMAL LOW (ref 136–145)
Total Protein: 5.8 g/dL — ABNORMAL LOW (ref 6.4–8.2)

## 2013-11-21 LAB — MAGNESIUM: Magnesium: 1.8 mg/dL

## 2013-11-21 LAB — ETHANOL

## 2013-11-21 LAB — TROPONIN I

## 2013-11-21 LAB — AMMONIA: Ammonia, Plasma: 22 mcmol/L (ref 11–32)

## 2013-11-22 LAB — BASIC METABOLIC PANEL
ANION GAP: 6 — AB (ref 7–16)
BUN: 18 mg/dL (ref 7–18)
CO2: 27 mmol/L (ref 21–32)
Calcium, Total: 8.2 mg/dL — ABNORMAL LOW (ref 8.5–10.1)
Chloride: 105 mmol/L (ref 98–107)
Creatinine: 1.18 mg/dL (ref 0.60–1.30)
EGFR (African American): >60
GFR CALC NON AF AMER: 58 — AB
Glucose: 68 mg/dL (ref 65–99)
OSMOLALITY: 276 (ref 275–301)
Potassium: 3.9 mmol/L (ref 3.5–5.1)
SODIUM: 138 mmol/L (ref 136–145)

## 2013-11-22 LAB — CBC WITH DIFFERENTIAL/PLATELET
BASOS ABS: 0 10*3/uL (ref 0.0–0.1)
BASOS PCT: 0.6 %
EOS ABS: 0.5 10*3/uL (ref 0.0–0.7)
EOS PCT: 9.8 %
HCT: 26 % — ABNORMAL LOW (ref 40.0–52.0)
HGB: 8.8 g/dL — ABNORMAL LOW (ref 13.0–18.0)
Lymphocyte #: 1.3 10*3/uL (ref 1.0–3.6)
Lymphocyte %: 25.5 %
MCH: 33.9 pg (ref 26.0–34.0)
MCHC: 33.8 g/dL (ref 32.0–36.0)
MCV: 100 fL (ref 80–100)
MONO ABS: 0.3 x10 3/mm (ref 0.2–1.0)
MONOS PCT: 6.1 %
NEUTROS PCT: 58 %
Neutrophil #: 2.9 10*3/uL (ref 1.4–6.5)
PLATELETS: 214 10*3/uL (ref 150–440)
RBC: 2.6 10*6/uL — AB (ref 4.40–5.90)
RDW: 16 % — ABNORMAL HIGH (ref 11.5–14.5)
WBC: 5 10*3/uL (ref 3.8–10.6)

## 2013-11-23 LAB — CBC WITH DIFFERENTIAL/PLATELET
BASOS PCT: 0.5 %
Basophil #: 0 10*3/uL (ref 0.0–0.1)
EOS PCT: 7.8 %
Eosinophil #: 0.4 10*3/uL (ref 0.0–0.7)
HCT: 24 % — ABNORMAL LOW (ref 40.0–52.0)
HGB: 7.9 g/dL — ABNORMAL LOW (ref 13.0–18.0)
Lymphocyte #: 1.5 10*3/uL (ref 1.0–3.6)
Lymphocyte %: 32.2 %
MCH: 32.9 pg (ref 26.0–34.0)
MCHC: 32.9 g/dL (ref 32.0–36.0)
MCV: 100 fL (ref 80–100)
MONO ABS: 0.3 x10 3/mm (ref 0.2–1.0)
MONOS PCT: 7.2 %
NEUTROS ABS: 2.4 10*3/uL (ref 1.4–6.5)
Neutrophil %: 52.3 %
Platelet: 226 10*3/uL (ref 150–440)
RBC: 2.4 10*6/uL — AB (ref 4.40–5.90)
RDW: 15.6 % — AB (ref 11.5–14.5)
WBC: 4.6 10*3/uL (ref 3.8–10.6)

## 2013-11-23 LAB — URINE CULTURE

## 2013-11-25 LAB — URINALYSIS, COMPLETE
BACTERIA: NONE SEEN
Bilirubin,UR: NEGATIVE
Blood: NEGATIVE
Glucose,UR: NEGATIVE mg/dL (ref 0–75)
Ketone: NEGATIVE
Leukocyte Esterase: NEGATIVE
NITRITE: NEGATIVE
Ph: 7 (ref 4.5–8.0)
Protein: NEGATIVE
Specific Gravity: 1.006 (ref 1.003–1.030)
Squamous Epithelial: NONE SEEN

## 2013-11-25 LAB — BASIC METABOLIC PANEL
ANION GAP: 4 — AB (ref 7–16)
BUN: 14 mg/dL (ref 7–18)
CO2: 28 mmol/L (ref 21–32)
Calcium, Total: 8.3 mg/dL — ABNORMAL LOW (ref 8.5–10.1)
Chloride: 105 mmol/L (ref 98–107)
Creatinine: 1.12 mg/dL (ref 0.60–1.30)
Glucose: 92 mg/dL (ref 65–99)
Osmolality: 274 (ref 275–301)
Potassium: 4.3 mmol/L (ref 3.5–5.1)
Sodium: 137 mmol/L (ref 136–145)

## 2013-11-25 LAB — MAGNESIUM: Magnesium: 1.7 mg/dL — ABNORMAL LOW

## 2013-11-25 LAB — TROPONIN I: TROPONIN-I: 0.05 ng/mL

## 2013-11-26 ENCOUNTER — Ambulatory Visit: Payer: Self-pay | Admitting: Internal Medicine

## 2013-11-26 LAB — URINE CULTURE

## 2013-12-05 ENCOUNTER — Ambulatory Visit: Payer: Self-pay | Admitting: Internal Medicine

## 2013-12-05 DEATH — deceased

## 2014-12-24 NOTE — H&P (Signed)
PATIENT NAME:  Nicholas [name], Nicholas [name] MR#:  [id number] DATE OF BIRTH:  09/07/1930  DATE OF ADMISSION:  08/29/2012  PRIMARY CARE PHYSICIAN:  Rhona Leavens. Burnett Sheng, MD at the [facility].  REFERRING PHYSICIAN:  Sheryl L. Mindi Junker, MD  CHIEF COMPLAINT:  Right lower extremity redness and swelling.   HISTORY OF PRESENT ILLNESS:  The patient is a 81 year old Caucasian male with a past medical history of coronary artery disease, ischemic cardiomyopathy, systolic congestive heart failure with an ejection fraction of 35% to 45% as per the records from the year 2012, chronic kidney disease with baseline creatinine at 2.0, diabetes mellitus, ambulatory dysfunction, chronic atrial fibrillation, peripheral vascular disease, anemia of chronic kidney disease, TIA and osteoarthritis presented in the ER with a chief complaint of right leg redness and swelling. The patient is reporting that his leg became red today evening associated with the swelling. His son who just came from [place] to visit him is concerned and brought him to the ER. The patient's BUN is 53, creatinine is 2.14 and potassium is 5.3. For lower extremity edema, the ER physician has given Lasix at 80 mg IV and vancomycin 1 gram IV was given for cellulitis and hospitalist team is called to admit the patient. The patient denies any chest pain or shortness of breath. Denies any abdominal pain, nausea, vomiting, diarrhea. Denies any fevers. The family is reporting that the patient has not seen his primary care physician for the past 2 years. The wife is also reporting that patient has chronic debility and just started working with physical therapy regarding ambulation. No other complaints.   PAST MEDICAL HISTORY:  Coronary artery disease, ischemic cardiomyopathy, transient ischemic attack, congestive heart failure, systolic, with ejection fraction of 35% to 45% as per the 2012 records, peripheral vascular disease status post left below-knee amputation, hypertension,  non-insulin-dependent diabetes mellitus, hyperlipidemia, chronic atrial fibrillation not on any Coumadin, osteoarthritis.   PAST SURGICAL HISTORY:  Left below-knee amputation.   ALLERGIES:  No known drug allergies.   HOME MEDICATIONS:  Vitamin D 1000 International Units once daily, vitamin C 1500 mg once daily, tamsulosin 0.4 mg once daily, simvastatin 20 mg once daily, potassium gluconate 595 mg p.o. once a day, omeprazole 1 capsule p.o. 2 times a day, metoprolol tartrate 50 mg 1 tablet twice a day, megestrol 20 mL once a day, lisinopril 10 mg once daily, glipizide 5 mg once daily, gabapentin 300 mg once daily, clopidogrel 75 mg once daily, cholecalciferol 1000 International Units 1 capsule p.o. once a day, aspirin 81 mg once daily, Tylenol 1 tablet p.o. every 6 hours as needed.   PSYCHOSOCIAL HISTORY:  Lives at home with wife. He used to smoke, but quit smoking. Denies any alcohol or illicit drug usage.   FAMILY HISTORY:  Father had a history of coronary artery disease and deceased with heart attack. Hypertension runs in his family.   REVIEW OF SYSTEMS:  CONSTITUTIONAL: Denies any fever, fatigue. Complaining of chronic weakness, right lower extremity pain. Denies any weight loss or weight gain.  EYES: Denies any blurry vision, glaucoma, cataracts, inflammation, redness.  ENT: Denies tinnitus, ear pain, ear discharge, postnasal drip, or difficulty in swallowing.  RESPIRATORY: Denies cough, wheezing, hemoptysis, dyspnea, asthma, painful respiration.  CARDIOVASCULAR: Denies any chest pain, syncope, palpitations, varicose veins.  GASTROINTESTINAL: Denies nausea, vomiting, diarrhea, abdominal pain, hematemesis, melena, GERD, jaundice, hemorrhoids.  GENITOURINARY: Denies dysuria, hematuria, urinary frequency, or incontinence.  ENDOCRINE: Denies polyuria, polyphagia, polydipsia. Denies any heat or cold intolerance. Denies any thyroid problems.  HEMATOLOGIC/LYMPHATIC: The patient has chronic anemia.  He has multiple bruises on his upper and lower extremities. Denies any bleeding.  INTEGUMENTARY: Denies acne. Multiple bruises are present on upper and lower extremities. Denies any lesions, rash.  MUSCULOSKELETAL: Denies any pain in the neck, back, shoulder, knee or hip. The patient has limited activity with chronic debility. He has a left below-the-knee amputation. Right great toe was resected.  NEUROLOGIC: Denies any ataxia, dementia, headache, tremors, numbness, weakness or dysarthria.  PSYCHIATRIC: Denies any insomnia, ADD, OCD, bipolar disorder.   PHYSICAL EXAMINATION: VITAL SIGNS: Temperature 98.2, pulse 90, respiratory rate 18, and blood pressure 143/70.  GENERAL APPEARANCE: Not in acute distress, answering questions appropriately. The patient is moderately built and nourished.  HEENT: Normocephalic, atraumatic. Pupils are equally reacting to light and accommodation. No conjunctival injection. No nasal congestion. No sinus tenderness. No ear discharge. No postnasal drip.  NECK: Supple. No JVD. No thyromegaly. No carotid bruits.  LUNGS: Clear to auscultation bilaterally. No accessory muscle usage. Moderate air entry. No anterior chest wall tenderness.  CARDIAC: S1, S2 normal. Regular rate and rhythm. Peripheral pulse at 1+. Right lower extremity is edematous.  GASTROINTESTINAL: Soft. Bowel sounds are positive in all 4 quadrants. Nontender, nondistended. No masses felt.  NEUROLOGIC: Awake, alert, and oriented x 3. Answering questions appropriately. Motor and sensory are grossly intact. No cerebellar signs. Reflexes are 2+.  MUSCULOSKELETAL: No joint effusion, erythema or tenderness. The patient has a slightly edematous right lower extremity. Right lower extremity is erythematous. Excoriations are present, nontender, and 1+ pitting edema is present. Left-sided below-the-knee amputation is present. The patient is status post right great toe resection.  SKIN: Warm to touch. Multiple bruises are  present on the upper and lower extremities. Normal turgor. No acne noted. In the sacral area, a stage I decubitus is noted which is quite superficial with 1 cm  skin breakdown.   PSYCHIATRIC: Normal mood and affect.   LABS AND IMAGING STUDIES:  Glucose 97, BUN 53, creatinine 2.1, sodium 138, potassium 5.3, chloride 109, CO2 is 22, GFR 33, anion gap 7, serum osmolality 295, calcium is 8.4, total protein 6.4, albumin 3.3, bilirubin total 0.3, alkaline phosphatase 119, AST 6, ALT 20. WBC 9.7, hemoglobin 9.9, hematocrit 31.8, platelet count 167,000, MCV 104. PT 11.6 and INR is 0.8.   ASSESSMENT AND PLAN:  The patient is a 81 year old Caucasian male, patient of [facility], was brought into the Emergency Room with redness and swelling of the right lower extremity. He will be admitted with the following assessment and plan:  1.  Right lower extremity cellulitis. The patient has received 1 dose of vancomycin intravenously in the Emergency Room. We will continue Tygacil 50 mg intravenously q. 12 hours.  2.  Chronic kidney disease. Renal function is close to baseline at 2.0. We will hold off on the angiotensin-converting enzyme inhibitor and monitor his renal function closely. If necessary, we will provide him gentle hydration with intravenous fluids.  3.  Hyperkalemia. The patient is status post 80 mg of intravenous Lasix in the Emergency Room. We will hold off on the angiotensin-converting enzyme inhibitor and potassium supplements which are his home medications and check BMP in a.m.  4.  Chronic history of congestive heart failure, systolic in nature, according to the previous ejection fraction done in 2012 during previous admission. The patient's ejection fraction is within 35% to 45%. We will continue aspirin, adding beta-blocker and Plavix which are his home medications. Lasix 80 mg intravenously was given in  the Emergency Room. Further management of Lasix by rounding physician depending on the renal function  results in the a.m.  5.  Hypertension, uncontrolled. We will titrate medications on an as-needed basis. We will continue home medication of beta-blocker, but hold off on the angiotensin-converting enzyme inhibitor in view of hyperkalemia.  6.  Diabetes mellitus. We will continue his home medication and insulin sliding scale.  7.  Chronic history of atrial fibrillation, rate controlled. The patient is not on Coumadin. We will continue his home medications of metoprolol and aspirin.  8.  The patient needs deconditioning, so a physical therapy consult will be placed. We will consult case management for placement versus transfer [facility] as requested by the family. We will provide him gastrointestinal prophylaxis with ranitidine and deep vein thrombosis prophylaxis with heparin 5000 units subcutaneous q. 8 hours.  9.  The patient is full code.   The diagnosis and plan of care were discussed in detail with the patient and his wife who was at the bedside. They both verbalized understanding of the plan.   TOTAL TIME SPENT ON THE ADMISSION:  55 minutes.   ____________________________ Ramonita LabAruna Maili Shutters, MD ag:si D: 08/29/2012 23:29:00 ET T: 08/30/2012 00:21:42 ET JOB#: [id number]  cc: Rhona LeavensJames F. Burnett ShengHedrick, MD Ramonita LabAruna Retina Bernardy, MD, <Dictator>   Ramonita LabARUNA Jaimee Corum MD ELECTRONICALLY SIGNED 08/30/2012 22:42

## 2014-12-24 NOTE — Discharge Summary (Signed)
PATIENT NAME:  Nicholas [name], Nicholas [name] MR#:  [id number] DATE OF BIRTH:  08-23-1931  DATE OF ADMISSION:  08/29/2012 DATE OF DISCHARGE:  08/30/2012  PRIMARY CARE PHYSICIAN:  Rhona LeavensJames F. Burnett ShengHedrick, MD  DISCHARGE DIAGNOSES:   1.  Right lower extremity cellulitis.  2.  Hyperkalemia.  3.  Chronic kidney disease.  4.  Congestive heart failure.  5.  Hypertension.  6.  Diabetes.  7.  Coronary artery disease.   CONDITION:  Stable.   CODE STATUS:  Full code.   MEDICATIONS:  Please refer to the medication reconciliation list in [facility].   DISCHARGE INSTRUCTIONS:  We will stop patient's home medication of potassium due to hyperkalemia.   DIET:  Low sodium, low fat, low cholesterol, ADA diet.   ACTIVITY:  As tolerated.   FOLLOW-UP CARE:  Follow up with PCP within 1 to 2 weeks and resume home health.   REASON FOR ADMISSION:  Right lower extremity redness and swelling.   HOSPITAL COURSE:  The patient is a 81 year old Caucasian male with a history of CAD, cardiomyopathy, CHF, CKD with baseline creatinine of 2.0, diabetes, chronic atrial fibrillation, PVD and anemia who presented to the ED with right leg redness and swelling. For detailed history and physical examination, please refer to the admission note dictated by Dr. Amado CoeGouru. The patient was noted to have a potassium of 5.3 yesterday and the lower extremity edema was treated with Lasix 80 mg IV and vancomycin 1 gram IV and admitted for right lower extremity cellulitis. The patient has no complaints, but his potassium is elevated at 5.7 today so his potassium was on hold and lisinopril was on hold due to hyperkalemia. He was treated with Kayexalate and insulin D50. Potassium decreased to 4.7. The hospitalist from the [facility], Dr. Darlyn ReadSaloean, called me and suggested that the patient does not need to transfer to [facility] and does not need to stay in the hospital. The patient can be discharged if potassium is normal so we repeated a potassium just now and it was 4.7.  The patient only has mild right lower extremity cellulitis. The patient will be discharged with antibiotics. The patient is clinically stable and will be discharged to home with home health today. I discussed the patient's discharge plan with the patient and the case manager.   TIME SPENT:  About 32 minutes.    ____________________________ Shaune PollackQing Marton Malizia, MD qc:si D: 08/30/2012 14:59:29 ET T: 08/30/2012 20:04:27 ET JOB#: [id number]  cc: Shaune PollackQing Inigo Lantigua, MD, <Dictator> Shaune PollackQING Ilee Randleman MD ELECTRONICALLY SIGNED 08/31/2012 21:06

## 2014-12-27 NOTE — H&P (Signed)
PATIENT NAME:  Nicholas [name], Nicholas [name] MR#:  [id number] DATE OF BIRTH:  [birth year]  DATE OF ADMISSION:  01/22/2013  PRIMARY CARE PHYSICIAN:  Rhona Leavens. Burnett Sheng, MD   REFERRING PHYSICIAN:  Dr. Sharma Covert  CHIEF COMPLAINT: Confusion and a right lower extremity color change for 3 days.   HISTORY OF PRESENT ILLNESS: A 81 year old Caucasian male with a history of hypertension, diabetes, CAD, hyperlipidemia, was sent from nursing home due to confusion and a right lower extremity color change. The patient is confused, unable to provide any history. According to the patient's son,  the patient had a CABG 2 weeks ago, due to MI, in [place]. The patient was discharged to a nursing home 2 weeks ago. The patient's mental status was fine until  3 days ago. He developed confusion and poor oral intake. In addition, the patient was noted to have a color change in the right lower extremity, so he was sent to the ED for further evaluation. In the ED, the patient was noted to have elevated BUN and creatinine, left lower extremity cyanosis. According to Dr. Sharma Covert, the patient has no pedal pulses; so, the patient was started with a heparin drip.  Dr. Sharma Covert discussed with Dr. Wyn Quaker, who will evaluate the patient.   PAST MEDICAL HISTORY: Hypertension, diabetes, BPH, hyperlipidemia, CAD, GERD, depression, phantom pain, CKD.    PAST SURGICAL HISTORY: Left BKA, vein stripping, CABG 2 weeks ago.   FAMILY HISTORY: According to previous documents, the patient's father died of MI and also had diabetes. His mother died of MI and also had diabetes.   REVIEW OF SYSTEMS: Unable to obtain due to patient's mental status.   ALLERGIES: No.    MEDICATIONS: Acetaminophen/hydrocodone 325 mg/5 mg p.o. 1 tab 4 times a day p.r.n., aspirin 81 mg p.o. daily, cholecalciferol 1000 international units capsule 1 cap b.i.d., citalopram 10 mg p.o. daily, Plavix 75 mg p.o. daily, fish oil 1200 mg p.o. capsule 1 cap b.i.d., gabapentin 300 mg p.o. b.i.d., glipizide 5  mg p.o. 1 tablet once a day, lisinopril 10 mg p.o. daily, loperamide 2 mg p.o. 4 times a day p.r.n. for diarrhea, loratadine 10 mg p.o. once a day p.r.n. for allergy, Megace 40 mg/mL 20 mL once a day, Lopressor 50 mg p.o. b.i.d., omeprazole 20 mg p.o. b.i.d., Zocor 20 mg p.o. at bedtime, Flomax 0.4 mg p.o. daily, vitamin C 500 mg p.o. daily   PHYSICAL EXAMINATION: VITAL SIGNS: Temperature 98.7, blood pressure 110/50, pulse 76, respirations 20, O2  saturation 95% on room air.  GENERAL: The patient is confused, but in no acute distress. Following limited commands.  HEENT: Pupils are round, equal, react to light. No discharge from ear or nose. Dry oral mucosa.  NECK: Supple. No JVD or carotid bruits. No lymphadenopathy. No thyromegaly.  CARDIOVASCULAR: S1, S2 regular rate and rhythm. No murmurs or gallops.  PULMONARY: Bilateral air entry. No wheezing or rales. No use of accessory muscles to breathe.  ABDOMEN: Soft. No distention or tenderness. No organomegaly. Bowel sounds present.  EXTREMITIES: Left-sided BKA. There is cyanosis in the whole right lower extremity, which is cold, but the patient has sensation. No pedal pulses.  SKIN: No rash or jaundice.  NEUROLOGY: The patient is confused, follows limited commands. He can move left lower extremity, but cannot move the right lower extremity.   LABORATORY, DIAGNOSTIC AND RADIOLOGIC DATA: CAT scan of head: Diffuse atrophy and changes of white matter, chronic ischemia; no acute abnormality.   Chest x-ray: No acute abnormality.  ABG showed venous pH of 7.41, pCO2 of 50. PTT is 32.9. CK 1558, CK-MB 5.1. WBC 12.5, hemoglobin 9.4, platelets 399. Glucose 101, BUN 86, creatinine 2.61, sodium 133, potassium 3.4, chloride 95, bicarbonate 29, albumin 2.7, INR 1.1. Troponin 0.03.   EKG showed sinus rhythm with premature supraventricular complexes with frequent PVCs at 83 BPM.   IMPRESSIONS: 1.  Encephalopathy, possibly due to acute renal failure, dehydration,  peripheral arterial disease, rhabdomyolysis.  2.  Acute renal failure. 3.  Rhabdomyolysis, possibly due to acute ischemia of left lower extremity.  4.  Acute left lower extremity ischemia.  5.  Peripheral arterial disease.   6.  Leukocytosis, possibly due to reaction.  7.  Coronary artery disease.  8.  Anemia.   PLAN OF TREATMENT: 1.  The patient will be admitted to medical floor with telemonitor. Will continue heparin drip, follow up CBC, follow up with Dr. Wyn Quakerew.  2.  We will hold aspirin and Plavix for possible surgery and continue Zocor.  3.  For acute renal failure, we will hold lisinopril, give normal saline IV, follow up BMP and we will get a nephrology consult.  4.  For hypertension, which is controlled, we will continue with Lopressor, but hold Lopressor if  blood pressure is low.  5.  For diabetes, we will hold glipizide and start sliding scale and check hemoglobin A1c.  6.  Gastrointestinal and deep vein thrombosis prophylaxis.  7. I discussed the patient's condition and the plan of treatment with the patient's son. The patient's son said the patient wants a FULL CODE.   TIME SPENT: About 62 minutes    ____________________________ Shaune PollackQing Ronne Stefanski, MD qc:cc D: 01/22/2013 20:47:00 ET T: 01/22/2013 21:36:44 ET JOB#: [id number]  cc: Shaune PollackQing Lauris Serviss, MD, <Dictator>  Shaune PollackQING Sacora Hawbaker MD ELECTRONICALLY SIGNED 01/24/2013 15:22

## 2014-12-27 NOTE — Consult Note (Signed)
CHIEF COMPLAINT and HISTORY:  Subjective/Chief Complaint right leg ischemia, confusion, ARF   History of Present Illness Patient is a 81 yo gentleman with extensive history of PAD and previous revascularizations.  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  Had MI and CABG x 2 about 3 weeks ago.  Was discharged to [facility].  Developed confusion several days ago with poor PO intake.  Here with markely elevated BUN and Cr and dehydration with ARF present.  Has had pain and color changes to foot for maybe 3-4 days.  He can not really provide history and wife not sure how long this has been a problem.  When patient asked how long the foot and leg have been bothering him, he says at least several months but not this bad.  His foot has minimal capillary refill and is quite cyanotic.  Has wounds on shin area that are not healing.   Past Medical Health Coronary Artery Disease, Hypertension   PAST MEDICAL/SURGICAL HISTORY:  Past Medical History:   Depression:    Osteoporosis:    DJD:    GERD:    BPH:    Failure to Thrive:    Ambulatory Dysfunction:    Anemia:    TIA - Transient Ischemic Attack:    Coronary artery disease:    Diabetes:    HTN:    Hyperlipidemia:    Aphasia:    Peripheral vascular disease:    Right leg arterial bypass:    Left BKA:   ALLERGIES:  Allergies:  No Known Allergies:   HOME MEDICATIONS:  Home Medications: Medication Instructions Status  lisinopril 10 mg oral tablet 1 tab(s) orally once a day Active  tamsulosin 0.4 mg oral capsule 1 cap(s) orally once a day Active  metoprolol tartrate 50 mg oral tablet 1 tab(s) orally 2 times a day Active  megestrol 40 mg/mL oral suspension 20 milliliter(s) orally once a day Active  clopidogrel 75 mg oral tablet 1 tab(s) orally once a day Active  glipiZIDE 5 mg oral tablet 0.5 tab(s) orally once a day Active  cholecalciferol 1000 intl units oral capsule 1 cap(s) orally 2 times a day  Active  gabapentin 300 mg oral capsule 1 cap(s) orally 2 times a day Active  acetaminophen-HYDROcodone 325 mg-5 mg tablet 1 tab(s) orally 4 times a day, As Needed - for Pain Active  citalopram 10 mg oral tablet 1 tab(s) orally once a day Active  Vitamin C 500 mg oral tablet 1 tab(s) orally once a day Active  loperamide 2 mg oral capsule 1 cap(s) orally 4 times a day, As Needed - for Diarrhea Active  Fish Oil 1200 mg oral capsule 1 cap(s) orally 2 times a day Active  aspirin 81 mg oral delayed release tablet 1 tab(s) orally once a day Active  loratadine 10 mg oral tablet 1 tab(s) orally once a day, As Needed for allergies Active  simvastatin 20 mg oral tablet 1 tab(s) orally once a day (at bedtime) Active  omeprazole 20 mg oral delayed release capsule 1 cap(s) orally 2 times a day Active   Family and Social History:  Family History Coronary Artery Disease  Hypertension   Social History positive tobacco (Greater than 1 year), negative ETOH   Place of Living Nursing Home   Review of Systems:  ROS Pt not able to provide ROS  as he is too confused   Medications/Allergies Reviewed Medications/Allergies reviewed   Physical Exam:  GEN disheveled, chronically ill appearing  HEENT pink conjunctivae, dry oral mucosa   NECK No masses  trachea midline   RESP normal resp effort  no use of accessory muscles   CARD irregular rate  no JVD   ABD denies tenderness  soft  normal BS   GU foley catheter in place  clear yellow urine draining   LYMPH negative neck, negative axillae   EXTR positive cyanosis/clubbing, left BKA.  right foot cyanotic with minimal capillary refill in the upper foot and fixed skin changes distally.  Cool.  No pulses present below the groin   SKIN positive ulcers   NEURO difficult to assess.  Right leg is weak.  has some sensation present.  UE strength seems intact   PSYCH poor insight, lethargic   LABS:  Laboratory Results: Hepatic:    19-May-14 18:12,  Comprehensive Metabolic Panel  Bilirubin, Total 0.4  Alkaline Phosphatase 76  SGPT (ALT) 28  SGOT (AST) 75  Total Protein, Serum 7.5  Albumin, Serum 2.7  Routine Micro:    19-May-14 18:13, Blood Culture  Micro Text Report   BLOOD CULTURE    COMMENT                   NO GROWTH IN 8-12 HOURS     ANTIBIOTIC  Culture Comment   NO GROWTH IN 8-12 HOURS   Result(s) reported on 23 Jan 2013 at 07:33AM.  Micro Text Report   BLOOD CULTURE    COMMENT                   NO GROWTH IN 8-12 HOURS     ANTIBIOTIC  Culture Comment   NO GROWTH IN 8-12 HOURS   Result(s) reported on 23 Jan 2013 at 07:32AM.  Routine Chem:    19-May-14 18:12, Comprehensive Metabolic Panel  Glucose, Serum 101  BUN 86  Creatinine (comp) 2.61  Sodium, Serum 133  Potassium, Serum 3.4  Chloride, Serum 95  CO2, Serum 29  Calcium (Total), Serum 9.2  Osmolality (calc) 293  eGFR (African American) 26  eGFR (Non-African American) 22  eGFR values <59m/min/1.73 m2 may be an indication of chronic  kidney disease (CKD).  Calculated eGFR is useful in patients with stable renal function.  The eGFR calculation will not be reliable in acutely ill patients  when serum creatinine is changing rapidly. It is not useful in   patients on dialysis. The eGFR calculation may not be applicable  to patients at the low and high extremes of body sizes, pregnant  women, and vegetarians.  Anion Gap 9    20-May-14 088:41 Basic Metabolic Panel (w/Total Calcium)  Glucose, Serum 84  BUN 77  Creatinine (comp) 2.21  Sodium, Serum 137  Potassium, Serum 3.7  Chloride, Serum 100  CO2, Serum 27  Calcium (Total), Serum 8.9  Anion Gap 10  Osmolality (calc) 296  eGFR (African American) 32  eGFR (Non-African American) 27  eGFR values <617mmin/1.73 m2 may be an indication of chronic  kidney disease (CKD).  Calculated eGFR is useful in patients with stable renal function.  The eGFR calculation will not be reliable in acutely ill  patients  when serum creatinine is changing rapidly. It is not useful in   patients on dialysis. The eGFR calculation may not be applicable  to patients at the low and high extremes of body sizes, pregnant  women, and vegetarians.    20-May-14 04:22, Hemoglobin A1c ([facility])  Hemoglobin A1c ([facility]) 7.0  The American Diabetes Association recommends that a  primary goal of  therapy should be <7% and that physicians should reevaluate the  treatment regimen in patients with HbA1c values consistently >8%.    20-May-14 04:22, Magnesium, Serum  Magnesium, Serum 2.1  1.8-2.4  THERAPEUTIC RANGE: 4-7 mg/dL  TOXIC: > 10 mg/dL   -----------------------  Cardiac:    19-May-14 18:12, Cardiac Panel  CK, Total 1558  CPK-MB, Serum 5.1  Result(s) reported on 22 Jan 2013 at 07:29PM.    19-May-14 18:12, Troponin I  Troponin I 0.03  0.00-0.05  0.05 ng/mL or less: NEGATIVE   Repeat testing in 3-6 hrs   if clinically indicated.  >0.05 ng/mL: POTENTIAL   MYOCARDIAL INJURY. Repeat   testing in 3-6 hrs if   clinically indicated.  NOTE: An increase or decrease   of 30% or more on serial   testing suggests a   clinically important change  Routine UA:    19-May-14 22:22, Urinalysis  Color (UA) Yellow  Clarity (UA) Clear  Glucose (UA) Negative  Bilirubin (UA) Negative  Ketones (UA) Negative  Specific Gravity (UA) 1.011  Blood (UA) Negative  pH (UA) 5.0  Protein (UA) Negative  Nitrite (UA) Negative  Leukocyte Esterase (UA) Negative  Result(s) reported on 22 Jan 2013 at 10:40PM.  RBC (UA) 1 /HPF  WBC (UA) 1 /HPF  Bacteria (UA) TRACE  Epithelial Cells (UA)   NONE SEEN  Mucous (UA) PRESENT  Hyaline Cast (UA) 7 /LPF  Result(s) reported on 22 Jan 2013 at 10:40PM.  Routine Coag:    19-May-14 18:12, Activated PTT  Activated PTT (APTT) 32.9  A HCT value >55% may artifactually increase the APTT. In one study,  the increase was an average of 19%.  Reference: "Effect on Routine and Special Coagulation  Testing Values  of Citrate Anticoagulant Adjustment in Patients with High HCT Values."  American Journal of Clinical Pathology 2006;126:400-405.    19-May-14 18:12, Prothrombin Time  Prothrombin 14.3  INR 1.1  INR reference interval applies to patients on anticoagulant therapy.  A single INR therapeutic range for coumarins is not optimal for all  indications; however, the suggested range for most indications is  2.0 - 3.0.  Exceptions to the INR Reference Range may include: Prosthetic heart  valves, acute myocardial infarction, prevention of myocardial  infarction, and combinations of aspirin and anticoagulant. The need  for a higher or lower target INR must be assessed individually.  Reference: The Pharmacology and Management of the Vitamin K   antagonists: the seventh ACCP Conference on Antithrombotic and  Thrombolytic Therapy. WSFKC.1275 Sept:126 (3suppl): [id number].  A HCT value >55% may artifactually increase the PT.  In one study,   the increase was an average of 25%.  Reference:  "Effect on Routine and Special Coagulation Testing Values  of Citrate Anticoagulant Adjustment in Patients with High HCT Values."  American Journal of Clinical Pathology 2006;126:400-405.    20-May-14 04:46, Activated PTT  Activated PTT (APTT) 53.2  A HCT value >55% may artifactually increase the APTT. In one study,  the increase was an average of 19%.  Reference: "Effect on Routine and Special Coagulation Testing Values  of Citrate Anticoagulant Adjustment in Patients with High HCT Values."  American Journal of Clinical Pathology 2006;126:400-405.  Routine Hem:    19-May-14 18:12, CBC Profile  WBC (CBC) 12.5  RBC (CBC) 2.99  Hemoglobin (CBC) 9.4  Hematocrit (CBC) 28.4  Platelet Count (CBC) 399  MCV 95  MCH 31.3  MCHC 33.0  RDW 16.3  Neutrophil % 82.5  Lymphocyte % 10.1  Monocyte % 5.0  Eosinophil % 2.1  Basophil % 0.3  Neutrophil # 10.3  Lymphocyte # 1.3  Monocyte # 0.6  Eosinophil #  0.3  Basophil # 0.0  Result(s) reported on 22 Jan 2013 at 07:05PM.    20-May-14 04:22, CBC Profile  WBC (CBC) 11.7  RBC (CBC) 2.86  Hemoglobin (CBC) 9.2  Hematocrit (CBC) 26.9  Platelet Count (CBC) 372  MCV 94  MCH 32.0  MCHC 34.0  RDW 16.5  Neutrophil % 80.7  Lymphocyte % 12.0  Monocyte % 4.8  Eosinophil % 2.0  Basophil % 0.5  Neutrophil # 9.4  Lymphocyte # 1.4  Monocyte # 0.6  Eosinophil # 0.2  Basophil # 0.1  Result(s) reported on 23 Jan 2013 at 04:54AM.   RADIOLOGY:  Radiology Results: XRay:    24-Dec-13 16:54, Foot Right Complete  Foot Right Complete  REASON FOR EXAM:    pain  COMMENTS:       PROCEDURE: DXR - DXR FOOT RT COMPLETE W/OBLIQUES  - Aug 29 2012  4:54PM     RESULT: Patient's had a right great toe amputation. No acute bony   abnormality identified. Surgical clips are noted about the ankle.   Vascular calcifications present. Degenerative change present. Diffuse   soft tissue swelling particularly dorsally.    IMPRESSION:  No acute bony abnormality. Soft tissue swelling is noted   diffusely particularly dorsally. No underlying lytic bony lesion. Chronic   changes as above.      Verified By: Osa Craver, M.D., MD    25-Apr-14 14:42, Chest PA and Lateral  Chest PA and Lateral  REASON FOR EXAM:    shortness of breath  COMMENTS:       PROCEDURE: DXR - DXR CHEST PA (OR AP) AND LATERAL  - Dec 29 2012  2:42PM     RESULT: History: Shortness of breath.    Comparison Study: Chest x-ray of 11/14/2010. Mediastinal and hilar   structures normal the lungs clear appear heart size normal. No acute bony   abnormalities. Degenerative changes thoracolumbar spine both shoulders.    IMPRESSION:  No acute pulmonary abnormality.        Verified By: Osa Craver, M.D., MD    19-May-14 18:24, Chest Portable Single View  Chest Portable Single View  REASON FOR EXAM:    ams  COMMENTS:       PROCEDURE: DXR - DXR PORTABLE CHEST SINGLE VIEW  - Jan 22 2013   6:24PM     RESULT: Comparison made to prior study of 12/29/2012. Mediastinum normal.   Lungs clear. Heart size normal.    IMPRESSION:  No acute abnormality.        Verified By: Osa Craver, M.D., MD  [place]:    25-Apr-14 21:04, [place] Color Flow Doppler Low Extrem Bilat (Legs)  [place] Color Flow Doppler Low Extrem Bilat (Legs)  REASON FOR EXAM:    shortness of breath, leg pain, r/o dvt  COMMENTS:       PROCEDURE: [place]  - US DOPPLER LOW EXTR BILATERAL  - Dec 29 2012  9:04PM     RESULT: Comparison: None    Findings: Multiple longitudinal and transverse gray-scale as well as   color and spectral Doppler images of  bilateral lower extremity veins   were obtained from the common femoral veins through the popliteal veins.    The right common femoral, greater saphenous, femoral, popliteal veins,   and venous trifurcation are patent, demonstrating  normal color-flow and   compressibility. No intraluminal thrombus is identified.There is normal   respiratory variation and augmentation demonstrated at all vein levels.   The left common femoral, greater saphenous, femoral, popliteal veins, and   venous trifurcation are patent, demonstrating normal color-flow and   compressibility. No intraluminal thrombus is identified.There is normal   respiratory variation and augmentation demonstrated at all vein levels.    IMPRESSION:      1. No evidence of DVT in the right lower extremity.  2. No evidence of DVT in the left lower extremity.    Dictation Site: 1        Verified By: Jennette Banker, M.D., MD  LabUnknown:    24-Dec-13 16:54, Foot Right Complete  PACS Image    25-Apr-14 14:42, Chest PA and Lateral  PACS Image    25-Apr-14 21:04, [place] Color Flow Doppler Low Extrem Bilat (Legs)  PACS Image    19-May-14 18:24, Chest Portable Single View  PACS Image    19-May-14 19:14, CT Head Without Contrast  PACS Image  CT:  CT Head Without Contrast  REASON FOR EXAM:    ams  COMMENTS:        PROCEDURE: CT  - CT HEAD WITHOUT CONTRAST  - Jan 22 2013  7:14PM     RESULT: History: Altered mental status.    Comparison Study: No prior.     Findings: Standard nonenhanced CT obtained. No mass. No hydrocephalus. No   hemorrhage. Diffuse atrophy present. White matter changes consistent   chronic ischemia. No acute bony abnormality.    IMPRESSION:  Diffuse atrophy and changes of white matter chronic   ischemia. No acute abnormality.    Verified By: Kyla Balzarine. REGISTER, M.D., MD   ASSESSMENT AND PLAN:  Assessment/Admission Diagnosis right LE ischemia which appears advanced, and has been present for several days.  Limb salvage unlikely and discussed with family.  On heparin ARF Recent heart surgery after MI s/p left BKA   Plan difficult situation.  The right leg is likely not salvageable and his ischemic changes appear to have been present for several days.  This may go back to some degree to around the time of his heart surgery and exacerbated by recent dehydration and ARF. Can do angiogram but the options for revascularization are going to be extremely limited.  Will need to see renal function improvement prior to angiogram or this may worsen his renal function and precipitate the need for dialysis.   The likely outcome is right AKA, and this was discussed in detail with the wife.   She describes she doesn't know how much more he can take, and I discussed getting Palliative care involved.  She did not seem to be ready for this, but the subject was discussed today with her. Will follow continue anticoagulation if he will tolerate If renal function improves, can do angiogram later this week [place]ve situation with high risk of limb loss, morbidity, and mortality.     level 5   Electronic Signatures: Algernon Huxley (MD)  (Signed 20-May-14 08:20)  Authored: Chief Complaint and History, PAST MEDICAL/SURGICAL HISTORY, ALLERGIES, HOME MEDICATIONS, Family and Social History, Review of  Systems, Physical Exam, LABS, RADIOLOGY, Assessment and Plan   Last Updated: 20-May-14 08:20 by Algernon Huxley (MD)

## 2014-12-27 NOTE — Op Note (Signed)
PATIENT NAME:  Nicholas [name], Nicholas [name] MR#:  [id number] DATE OF BIRTH:  Aug 27, 1931  DATE OF PROCEDURE:  01/01/2013  This is from an intraoperative consultation from Dr. Lady GaryFath in Cardiology for difficulty with access.   PREOPERATIVE DIAGNOSES: 1.  Coronary disease, for cardiac catheterization.  2.  Extensive peripheral vascular disease, with difficulties on initial access attempts by Dr. Lady GaryFath.  3.  Hypertension.  4.  Diabetes.  5.  Hyperlipidemia.   POSTOPERATIVE DIAGNOSES: 1.  Coronary disease, for cardiac catheterization.  2.  Extensive peripheral vascular disease, with difficulties on initial access attempts by Dr. Lady GaryFath.  3.  Hypertension.  4.  Diabetes.  5.  Hyperlipidemia.   PROCEDURE:  Ultrasound guidance for vascular access to left femoral artery and placement of a 6 French sheath.   SURGEON:  Annice NeedyJason S Dew, MD   ANESTHESIA:  Local with sedation.   BLOOD LOSS:  Minimal.   INDICATION FOR PROCEDURE: A 81 year old white male who is on the cardiac catheterization table for procedure. He has extensive peripheral vascular disease, and there have been difficulties obtaining access, and we are asked to help assist them gaining sheath access of the femoral artery.   DESCRIPTION OF PROCEDURE:  The procedure is joined on the cardiac catheterization table. The area has already been sterilely prepped and draped. The ultrasound machine is brought on the field. Ultrasound was used to visualize the femoral artery. It is calcified and diseased, but did appear to have pulsatile flow at the bifurcation of the femoral vessels on ultrasound. I accessed just above the femoral bifurcation of the common femoral artery with a micropuncture needle. Micropuncture wire and sheath were then placed. Arterial flow was seen to the micropuncture sheath, and I exchanged over an 0.035 wire for a 6 French sheath. The field was then turned over to Dr. Lady GaryFath for the completion of the procedure.      ____________________________ Annice NeedyJason S. Dew, MD jsd:mr D: 01/01/2013 16:27:53 ET T: 01/01/2013 20:43:05 ET JOB#: [id number]  cc: Annice NeedyJason S. Dew, MD, <Dictator> Annice NeedyJASON S DEW MD ELECTRONICALLY SIGNED 01/03/2013 13:24

## 2014-12-27 NOTE — Discharge Summary (Signed)
PATIENT NAME:  Nicholas [name], Nicholas [name] MR#:  [id number] DATE OF BIRTH:  1931-06-04  DATE OF ADMISSION:  07/17/2013 DATE OF DISCHARGE:  07/17/2013  PRIMARY CARE PHYSICIAN: Dr. Burnett Sheng.   DISCHARGE DIAGNOSES:  1.  Left knee pain, likely phantom pain from prior amputation.  2.  Mild cognitive impairment with dementia.  3.  Dehydration.  4.  Diabetes mellitus.  5.  Hypertension.  6.  Hypomagnesemia.  7.  Bigeminy secondary to hypomagnesemia, resolved.   IMAGING STUDIES: Include a CT scan of the head without contrast which showed no acute intracranial findings. Mild atrophy.   Left knee x-ray showed below-knee amputation. No acute findings. Did show diffuse osteopenia.   Lower extremity Doppler showed no deep vein thrombosis.   ADMITTING HISTORY AND PHYSICAL: Please see detailed H and P dictated by Dr. Cherlynn Kaiser. In brief, a 81 year old Caucasian male patient with prior history of bilateral above-below-knee amputation along with mild dementia, presented to the hospital with some confusion and left knee pain.   The patient was alert, oriented x3 by the time he was seen in the Emergency Room. With his excruciating left knee pain, was admitted to the hospitalist service. The patient had a CT scan of the head which was normal and lower extremity Dopplers which showed no DVT along with an x-ray which showed no acute findings except chronic osteopenia. The patient's pain did resolve on its own with some supportive measures from pain medication. This pain was likely phantom pain.   The patient had significant hypomagnesemia causing bigeminy and PVCs, and after replacing his IV magnesium, started on oral magnesium with which his PVCs and bigeminy resolved. The patient had mildly elevated TSH at 6.85 for which T3, T4 were checked, which were normal. Likely has sick euthyroid.   The patient seems to have progressively worsening dementia and will need to follow up with Dr. Sherryll Burger of neurology.   His diabetes,  hypertension were well controlled during the hospital stay.   The patient seem to have culture-negative UTI for which he has been on ceftriaxone and will be on 4 more days of ciprofloxacin, which likely caused his altered mental status.   Prior to discharge, the patient does not have any tenderness in the left lower knee on examination. No warmth, no redness.   DISCHARGE MEDICATIONS: Include:  1.  Ciprofloxacin 500 mg b.i.d. for 4 days.  2.  Tamsulosin 0.4 mg daily.  3.  Metoprolol tartrate 50 mg oral 2 times a day.  4.  Levodopa 75 mg oral once a day.  5.  Aspirin 81 mg daily.  6.  Depakote 250 mg oral once a day.  7.  Citalopram 20 mg oral once a day.  8.  Gabapentin 300 mg oral once a day.  9.  Glipizide 5 mg oral once a day.  10.  Simvastatin 40 mg half a tablet daily.  11.  Gabapentin 300 mg two tablets oral once a day at bedtime.  12.  Acetaminophen oxycodone 325/10, one tablet oral every 8 hours as needed for pain.   DISCHARGE INSTRUCTIONS: The patient has been set up with home health with physical therapy. A diabetic diet. Activity as tolerated. Follow up with primary care physician in 1 to 2 weeks. Follow up with Dr. Sherryll Burger of neurology in 1 to 2 weeks.   TIME SPENT ON DAY OF DISCHARGE IN DISCHARGE ACTIVITY: Was 79 minutes.    ____________________________ Molinda Bailiff Lucas Winograd, MD srs:np D: 07/18/2013 14:40:27 ET T: 07/18/2013 16:04:02 ET JOB#: [id number]  cc: Jamise Pentland R. Karman Biswell, MD, <Dictator> Rhona LeavensJames F. Burnett ShengHedrick, MD Hemang K. Sherryll BurgerShah, MD Orie FishermanSRIKAR R Emmaline Wahba MD ELECTRONICALLY SIGNED 07/23/2013 20:45

## 2014-12-27 NOTE — H&P (Signed)
PATIENT NAME:  Nicholas [name], Nicholas [name] MR#:  [id number] DATE OF BIRTH:  27-Nov-1930  DATE OF ADMISSION:  12/29/2012  PRIMARY CARE PHYSICIAN: Rhona LeavensJames F. Burnett ShengHedrick, MD  CHIEF COMPLAINT: Shortness of breath.   HISTORY OF PRESENT ILLNESS: This is a 81 year old man with multiple medical problems. Complains of shortness of breath, more with exertion, worse over the last few weeks. He has been feeling very weak, loss of strength. He has had a sore on his buttock. He is not that active since his prosthesis does not fit anymore. He woke up at 3:30 in the morning short of breath, needed to be boosted up in the bed. They called EMS at that time. Another episode of shortness of breath today, which brought him in for further evaluation. In the ER, he was found to have an elevated BNP. Chest x-ray was read as negative. ER physician told me that the patient's pulse ox dropped down with activity, but the patient has an amputation and his prosthesis does not fit. His pulse ox was 99% on room air when I saw him. Of note, care manager told me that Adult Protective Services was contacted by a neighbor and they will evaluate his case. Hospitalist services were contacted for further evaluation.   PAST MEDICAL HISTORY: Diabetes, hypertension, BPH, hyperlipidemia, coronary artery disease, gastroesophageal reflux disease, depression, phantom pain, chronic kidney disease.   PAST SURGICAL HISTORY: Amputation, vein stripping.   ALLERGIES: No known drug allergies.   FAMILY HISTORY: Father died in his 2390s of an MI and had diabetes. Mother died in her 10290s of an MI and had diabetes.   SOCIAL HISTORY: Lives with wife. No smoking. No alcohol in the past few years. No drug use. Used to work for [employer].   MEDICATIONS: As per Restaurant manager, fast foodrescription Writer include acetaminophen/hydrocodone 5/325 one tablet orally 4 times a day, aspirin 81 mg daily, vitamin D 1000 international units twice a day, Celexa 20 mg daily, Plavix 75 mg daily, fish oil 1200 mg twice a  day, gabapentin 300 mg twice a day, glipizide 5 mg 1/2 tablet daily, lisinopril 10 mg daily, loperamide 2 mg 4 times a day as needed for diarrhea, loratadine 1 capsule daily as needed, Megace 40 mg/mL 20 mL once a day, metoprolol 50 mg twice a day, omeprazole 20 mg twice a day, simvastatin 20 mg daily, Flomax 0.4 mg daily, vitamin C 500 mg daily.   REVIEW OF SYSTEMS:    CONSTITUTIONAL: Positive for chills. No fever or sweats. Positive for weight gain. Positive for weakness.  EYES: He does wear glasses.  EARS, NOSE, MOUTH AND THROAT: Decreased hearing. No sore throat. No difficulty swallowing.  CARDIOVASCULAR: Positive for shortness of breath. Positive for cough. No sputum. No hemoptysis.  GASTROINTESTINAL: No nausea. No vomiting. No abdominal pain. Positive for constipation. No bright red blood per rectum. No melena.  GENITOURINARY: No burning on urination. No hematuria.  MUSCULOSKELETAL: Positive for leg cramps and stinging in his arms.  NEUROLOGIC: No fainting or blackouts.  PSYCHIATRIC: Positive for depression.  ENDOCRINE: No thyroid problems.  HEMATOLOGIC AND LYMPHATIC: No anemia.  INTEGUMENTARY: Positive for itching with his stump shrinker.   PHYSICAL EXAMINATION:  VITAL SIGNS: Temperature 97.4, pulse 94, respirations 18, blood pressure 165/94, pulse ox 99% on room air.  GENERAL: No respiratory distress.  EYES: Conjunctivae and lids normal. Pupils equal, round and reactive to light. Extraocular ocular muscles intact. No nystagmus.  EARS, NOSE, MOUTH AND THROAT: Tympanic membranes: No erythema. Nasal mucosa: Erythematous. Throat: No erythema, no exudate  seen. Lips and gums: No lesions.  NECK: No JVD. No bruits. No lymphadenopathy. No thyromegaly. No thyroid nodules palpated.  RESPIRATORY: Decreased breath sounds bilaterally. Positive rales, bilateral bases. No use of accessory muscles to breathe.  CARDIOVASCULAR: S1, S2 normal. No gallops, rubs or murmurs heard. Carotid upstroke 2+  bilaterally. No bruits. Dorsalis pedis pulses 1+ right lower extremity. Right lower extremity 2+ edema.  ABDOMEN: Soft. The patient states that he is tender everywhere when I press. No tenderness when I am not pressing. No organomegaly/splenomegaly. Normoactive bowel sounds. No masses felt.  LYMPHATIC: No lymph nodes in the neck.  MUSCULOSKELETAL: 2+ edema of right lower extremity: No clubbing, no cyanosis.  SKIN: No ulcers seen, but stage II decubiti on the buttock with just reddening of the skin.  NEUROLOGIC: Cranial nerves II through XII grossly intact. Unable to elicit deep tendon reflexes bilateral lower extremities.  PSYCHIATRIC: The patient is oriented to person, place and time.   LABORATORY AND RADIOLOGICAL DATA: BNP 6700. TSH 4.31. Troponin negative. Glucose 222, BUN 42, creatinine 1.61, sodium 139, potassium 5.6, chloride 113, CO2 of 20, calcium 8.8. Liver function tests normal range. Albumin 2.8. White blood cell count 7.6, hemoglobin and hematocrit 10.1 and 30.1, platelet count of 205, MCV of 104. Urinalysis: 100 mg/dL protein. Chest x-ray read as negative. EKG showed normal sinus rhythm, 91 beats per minute, premature atrial complex.   ASSESSMENT AND PLAN:  1.  Shortness of breath: Possible mild congestive heart failure with weight gain, elevated BNP, rales at the bases, even though chest x-ray was read as negative. I will obtain an echocardiogram. Give 1 dose of Lasix here in the Emergency Room intravenously and continue oral Lasix on a daily basis. The patient is already on good medications with metoprolol and lisinopril. I will continue that.  2.  Weakness: I believe that part of the issue is that his prosthesis does not fit. Will get a physical therapy consultation.  3.  Stage II decubiti on the buttock, reddening of the skin: Will have them put a DuoDERM on that. I advised changing position in the bed every few hours.  4.  Hypertension: Blood pressure is slightly elevated. Will  continue to monitor on his usual medications.  5.  Diabetes: Continue Glucophage.  6.  Hyperlipidemia. Continue statin.  7.  Gastroesophageal reflux disease: On a proton pump inhibitor.  8.  Chronic kidney disease: Creatinine actually better today than it was previously. With borderline hyperkalemia, we will give a dose of Lasix and replace potassium. Check the potassium again tomorrow before treatment.  9.  History of coronary artery disease: The patient is on Plavix and metoprolol.  10.  Benign prostatic hypertrophy: On Flomax.  11.  Depression: On Celexa.  12.  Will get an ultrasound of the lower extremity to rule out deep vein thrombosis. Unable to do a CT scan of the chest secondary to elevated creatinine. Will hold off on further imaging at this point.  13.  Care manager informed me that Adult Protective Services was called by a neighbor. They will investigate. Also, the patient is a Merchandiser, retail patient and they will look in for a transfer to the [facility].   TIME SPENT ON ADMISSION: 55 minutes.   ____________________________ Herschell Dimes. Renae Gloss, MD rjw:jm D: 12/29/2012 17:13:06 ET T: 12/29/2012 18:04:10 ET JOB#: [id number]  cc: Herschell Dimes. Renae Gloss, MD, <Dictator> Rhona Leavens. Burnett Sheng, MD Salley Scarlet MD ELECTRONICALLY SIGNED 01/06/2013 12:59

## 2014-12-27 NOTE — Op Note (Signed)
PATIENT NAME:  Nicholas [name], Nicholas [name] MR#:  [id number] DATE OF BIRTH:  Jul 10, 1931  DATE OF PROCEDURE:  01/26/2013  PREOPERATIVE DIAGNOSES: 1.  Gangrene, right leg.  2.  Severe peripheral vascular disease.  3.  Coronary disease, status post recent coronary bypass grafting.  4.  Acute renal failure with some chronic kidney disease.   POSTOPERATIVE DIAGNOSES: 1.  Gangrene, right leg.  2.  Severe peripheral vascular disease.  3.  Coronary disease, status post recent coronary bypass grafting.  4.  Acute renal failure with some chronic kidney disease.   PROCEDURE:  Right above-knee amputation.   SURGEON:  Dew, MD   ANESTHESIA:  General  ESTIMATED BLOOD LOSS:  Approximately 100 mL  INDICATION FOR PROCEDURE:  A 81 year old white male who was in the hospital a few days ago. At the time of his admission he had irreversible ischemic changes to the right leg and started on a heparin drip, which showed no improvement. He was mottled up to the midcalf and cool to the knee.  He has had multiple previous avascular surgeries. He is already status post amputation of the left leg for gangrenous changes some years ago. He has not been ambulatory in many months. It was discussed with him and his family on multiple occasions that his ischemia is irreversible and would require amputation at this point and this would be a life-saving procedure as if he does not have this performed, he would almost certainly expire within days to weeks. They desire to proceed with right above-knee amputation. Risks and benefits were discussed. Informed consent was obtained.   DESCRIPTION OF PROCEDURE:  The patient was brought to the operative suite and after adequate level of general anesthesia was obtained, the right lower extremity was sterilely prepped and draped and a sterile surgical field was created. A fishmouth incision was created in the lower thigh, dissected down through the muscle and the soft tissues with electrocautery  dissecting out the femur. The periosteal elevator was used to help dissect out the femur several inches more proximally in the thigh.  The [employer] was placed behind the femur to help dissect out posteriorly. The oscillating saw was then used to transect the femur well above our closure line, so there would be no tension on the incision with the bone. The amputation knife was used to complete the posterior flap. There was thrombus within the deep femoral vein. Superficial femoral artery was occluded. The bleeding was all through small collateral blood vessels. These vessels were oversewn with 2-0 silk sutures and the nerve was ligated with free tie. After irrigation, 3 interrupted figure-of-eight 0-Vicryl sutures were used to close the deep space. The superficial fascia was closed with a series of interrupted figure-of-eight 0-Vicryl sutures. The subcutaneous tissue was closed with a running 2-0 Vicryl and the skin was coapted with staples. Xeroform, ABD, fluff Kerlix and wrap Kerlix and an ACE wrap were placed and these were covered with an [place] for a large bulky dressing. The patient was then awakened from anesthesia and taken to the recovery room in stable condition.   ____________________________ Annice NeedyJason S. Dew, MD jsd:ce D: 01/26/2013 12:55:51 ET T: 01/26/2013 13:14:24 ET JOB#: [id number]  cc: Annice NeedyJason S. Dew, MD, <Dictator> Annice NeedyJASON S DEW MD ELECTRONICALLY SIGNED 01/31/2013 13:52

## 2014-12-27 NOTE — Discharge Summary (Signed)
PATIENT NAME:  Nicholas [name], Nicholas [name] MR#:  [id number] DATE OF BIRTH:  1931/08/22  STAT TRANSFER SUMMARY  DATE OF ADMISSION:  12/29/2012 DATE OF DISCHARGE:  01/02/2013  [facility].   REASON FOR TRANSFER: Need for CABG and unavailability of cardiothoracic/vascular surgeons at [facility].   ACCEPTING PHYSICIAN: Dr. Robb MatarHsu Lin.   DISCHARGE DIAGNOSES: 1.  Three-vessel disease, with severe coronary artery disease found on cardiac catheterization.  2.  Anemia of chronic disease.  3.  Acute-on-chronic systolic congestive heart failure.  4.  Non-ST elevation myocardial infarction. 5.  Stage II sacral decubitus ulcers.  6.  Hypertension.  7.  Type 2 diabetes mellitus.  8.  Chronic kidney disease, stage III.   PROCEDURES: Cardiac catheterization which showed severe 3-vessel disease with cardiomyopathy by echocardiogram with ejection fraction of 45%, distal left main 80% stenosis, first diagonal 90% stenosis, second diagonal 75% stenosis, proximal circumflex 95% stenosis, and distal right coronary artery with 100% stenosis.   IMAGING STUDIES: Included a chest, PA  and lateral, showed mild pulmonary edema.   ADMITTING HISTORY AND PHYSICAL: Please see detailed H and P dictated on 12/29/2012.   In brief, a 81 year old patient with multiple comorbidities including coronary artery disease, CHF, who presented to the hospital with shortness of breath and feeling extremely weak. The patient was found to have CHF, was admitted.   HOSPITAL COURSE: 1.  N-STEMI: The patient was initially admitted for a CHF exacerbation to rule out acute coronary syndrome. His troponins trended up. Cardiology was consulted and took the patient to cardiac cath.  Found to have 3-vessel disease. Was suggested to have a CABG and [facility] being accepted by Dr. Robb MatarHsu Lin as discussed by Dr. Lady GaryFath of cardiology.  2.  Acute on chronic systolic CHF: Much-improved. The  patient is currently on room air.  3.  Anemia of chronic disease: The patient did have a mild drop in his hemoglobin after the CAT scan due to blood loss and also hydration, due to some mild hypotension during the cath. This needs to be monitored.   The patient's hypertension and diabetes are doing well.   Today on exam patient does not have any chest pain. His cardiac exam shows a mild systolic murmur.   Temperature 98.2, pulse 75, blood pressure 104/68, saturating 96% on room air.   INPATIENT MEDICATIONS: Please see attached Med Reconciliation.   Thank you for accepting the patient's  transfer. Please call back with any questions.   Time spent today on the discharge/transfer activity was 43 minutes.    ____________________________ Molinda BailiffSrikar R. Saga Balthazar, MD srs:dm D: 01/02/2013 11:56:01 ET T: 01/02/2013 12:25:08 ET JOB#: [id number]  cc: Wardell HeathSrikar R. Elpidio AnisSudini, MD, <Dictator> [facility]  Orie FishermanSRIKAR R Trusten Hume MD ELECTRONICALLY SIGNED 01/17/2013 13:52

## 2014-12-27 NOTE — Consult Note (Signed)
Brief Consult Note: Diagnosis: pt with history of nstemi in the past treated medically due to renal insuffiency. Nows with recurrent nstemi.   Patient was seen by consultant.   Consult note dictated.   Recommend further assessment or treatment.   Orders entered.   Discussed with Attending MD.   Comments: Pt with history of nstemi in 2011 now with recurrent nstemi. No further chest pain. Renal funciton improved. Will need to consider cardaic cath. Risk and benefits discussed with patient. Plan to proceed with cath on Monday. Full note to follow.  Electronic Signatures: Dalia HeadingFath, Flay Ghosh A (MD)  (Signed 26-Apr-14 10:18)  Authored: Brief Consult Note   Last Updated: 26-Apr-14 10:18 by Dalia HeadingFath, Mckenzi Buonomo A (MD)

## 2014-12-27 NOTE — Consult Note (Signed)
   General Aspect 81 yo male with history of cad with three vessel cad treated medically in the past with an ef of 45%, hypertension and diabetes who was admitted with complaints of chest pain and has ruled in for a nstemi with serum troponin of 1.5. Pain was described as mid sternal with radiation to his arm. He is currently pain free and stable. She reports compliance with his meds. He had a nstemi in the recent past treated medically due to renal insuffiency. His renal funciton is now at his baseline.   Physical Exam:  GEN well developed, no acute distress   HEENT PERRL, hearing intact to voice   NECK supple  No masses    RESP normal resp effort    CARD Regular rate and rhythm    ABD denies tenderness  no hernia  no Adominal Mass    LYMPH negative neck, negative axillae   EXTR negative cyanosis/clubbing, negative edema   SKIN normal to palpation   NEURO cranial nerves intact, motor/sensory function intact   PSYCH A+O to time, place, person   Review of Systems:  Subjective/Chief Complaint chest pain and shortness of breath   General: Fatigue    Skin: No Complaints    ENT: No Complaints    Eyes: No Complaints    Neck: No Complaints    Respiratory: No Complaints    Cardiovascular: Chest pain or discomfort  Tightness    Gastrointestinal: No Complaints    Genitourinary: No Complaints    Vascular: No Complaints    Musculoskeletal: No Complaints    Neurologic: No Complaints    Hematologic: No Complaints    Endocrine: No Complaints    Psychiatric: No Complaints    Review of Systems: All other systems were reviewed and found to be negative    Medications/Allergies Reviewed Medications/Allergies reviewed    EKG:  EKG NSR    Abnormal NSSTTW changes     No Known Allergies:    Impression Pt with histor of known cad now admitted with chest pain and has ruled in for a nstemi. He is hemodynaically stable and pain has imporved. He had a signifncat nstemi  severla years ago treated medically. He has ruled in for a mild nstemi currently and renal funciton appears near his baseline. Wil need ot consider invasive evaluation to assess anatomy to determine anatomy to guide further therapy   Plan 1. Conintue with heparin, nitreates, beta blockers and asa 2. Continue to rule out for mi 3 Risk and benefit for cardiac cath on monday and patient agrees to proceed 4. Further recs after cath 5 ok to tranfer to telemtry   Electronic Signatures: Dalia HeadingFath, Bonni Neuser A (MD)  (Signed 26-Apr-14 18:20)  Authored: General Aspect/Present Illness, History and Physical Exam, Review of System, Home Medications, EKG , Allergies, Impression/Plan   Last Updated: 26-Apr-14 18:20 by Dalia HeadingFath, Berit Raczkowski A (MD)

## 2014-12-27 NOTE — H&P (Signed)
PATIENT NAME:  Bryna ColanderCLOUD, Imran MR#:  [id number] DATE OF BIRTH:  10-01-1930  DATE OF ADMISSION:  07/14/2013  PRIMARY CARE PHYSICIAN: Dr. Tera MaterJim Hedrick.   CHIEF COMPLAINT: Altered mental status and left knee pain.   HISTORY OF PRESENT ILLNESS: This is an 81 year old male brought in to the hospital by his family due to altered mental status and complaining of specifically left knee pain but pain all over. As per the family, the patient has been more confused than usual over the past 2 to 3 days. He has been hallucinating, hearing voices, and also thinks that he is outside and [place]. The patient is usually alert and oriented and therefore, the family was a bit concerned and brought him to the ER for further evaluation. The patient normally winces with pain, even on minimal movement, but over the past couple of days has been complaining of more excruciating pain in his left knee area.   The patient has not had any fevers. Has not been having nausea, vomiting, any abdominal pain, diarrhea, or any other associated symptoms other than the above symptoms mentioned.   REVIEW OF SYSTEMS:    CONSTITUTIONAL: No documented fever. No weight gain. No weight loss.  EYES: No blurry or double vision.  EARS, NOSE, THROAT: No tinnitus. No postnasal drip. No redness of the oropharynx.  RESPIRATORY: No cough, no wheeze, no hemoptysis, no dyspnea.  CARDIOVASCULAR: No chest pain. No orthopnea. No palpitations. No syncope.  GASTROINTESTINAL: No nausea, no vomiting, no diarrhea. No abdominal pain. No melena or hematochezia.  GENITOURINARY: No dysuria or hematuria.  ENDOCRINE: No polyuria or nocturia, heat or cold intolerance.  HEMATOLOGIC: No anemia, no bruising, no bleeding.  INTEGUMENTARY: No rashes. No lesions.  MUSCULOSKELETAL: No arthritis. No swelling. No gout.  NEUROLOGIC: No numbness. No tingling. No ataxia. No seizure-type activity. PSYCHIATRIC: No anxiety, no insomnia, no ADD.  Positive depression.   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   ALLERGIES: No known drug allergies.   SOCIAL HISTORY: Used to smoke cigars for the past 40 to 50 years. No alcohol abuse. No illicit drug abuse. Lives at home with his wife.   FAMILY HISTORY: Both mother and father are deceased. Mother died from COPD. Father died from complications of heart disease.   CURRENT MEDICATIONS: Tylenol with hydrocodone 5/325 one tab 4 times daily as needed, aspirin 81 mg daily, vitamin D 1000 international units 2 caps daily, Celexa 10 mg daily, Plavix 75 mg daily, Depakote 250 mg daily, gabapentin 300 mg b.i.d., glipizide 2.5 mg daily, Glucerna shake t.i.d. with meals, loperamide 2 mg q.i.d. as needed for diarrhea, loratadine 10 mg daily as needed for allergies, Megace 20 mL daily, metoprolol tartrate 50 mg b.i.d., simvastatin 20 mg daily, Flomax 0.4 mg daily and vitamin C 500 mg daily.   PHYSICAL EXAMINATION:  VITAL SIGNS: Presently, temperature is 98.4, pulse 80, respirations 16, blood pressure 163/74, sats 98% on room air.  GENERAL: He is a pleasant-appearing male in no apparent distress.  HEAD, EYES, EARS, NOSE AND THROAT: The patient is atraumatic, normocephalic. Extraocular muscles are intact. Pupils are equal and reactive to light. Sclerae anicteric. No conjunctival injection. No pharyngeal erythema.  NECK: Supple. There is no jugular venous distention. No bruits. No lymphadenopathy. No thyromegaly.  HEART: Regular rate and rhythm. No murmurs. No rubs. No clicks.  LUNGS: Clear to auscultation  bilaterally. No rales. No rhonchi. No wheezes.  ABDOMEN: Soft, flat, nontender, nondistended. Has good bowel sounds. No hepatosplenomegaly appreciated.  EXTREMITIES: The patient has a right above-the-knee amputation and left  below-the-knee amputation. There is no redness, swelling or warmth noted in any of the lower extremities. He has +2 popliteal pulses in his left lower extremity.  NEUROLOGIC: The patient is alert, awake, oriented x 3 with no focal motor or sensory deficits appreciated bilaterally.  SKIN: Moist and warm. He does have a stage I decubitus ulcer on his left buttock, but no acute redness, swelling or drainage noted around the ulcer.  LYMPHATIC: There is no cervical or axillary lymphadenopathy.   LABORATORY AND RADIOLOGICAL DATA: Serum glucose of 95, BUN 26, creatinine 1.2, sodium 134, potassium 4.6, chloride 104, bicarbonate 25. The patient's LFTs are within normal limits. Ammonia less than 25. Troponin less than 0.02. Valproic acid 14. White cell count 8.1, hemoglobin 10.1, hematocrit 29.5, platelet count 242. Urinalysis within normal limits.   The patient did have an ultrasound of his left lower extremity which showed no evidence of any acute DVT. The patient also had an x-ray of his left knee showing just osteopenia, but no other acute findings. A chest x-ray showing no acute cardiopulmonary disease. The patient also had a CT scan of the head done without contrast, which showed no acute intracranial findings.   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  1.  Altered mental status: The exact etiology of this is unclear, whether this is acute delirium versus early cognitive decline from his underlying dementia, although he has not formally been diagnosed with dementia. I did do a Mini-Mental Status on him, which he did pretty well on. He has no focal metabolic abnormalities. His urinalysis is negative. His chest x-ray is negative. For now, I will follow  his mental status overnight and observe him overnight. Follow q.4 hour neuro checks. CT head is negative for any acute stroke.  2.  Left knee pain: Again, the exact etiology of this is also unclear. I suspect this is probably phantom pain from his previous amputation. His x-ray of his left knee shows no evidence of any acute osseous abnormalities. His ultrasound is negative for any deep vein thrombosis. He has no obvious cellulitis on exam. I will continue p.r.n. Norco for pain.  3.  Diabetes: No hypoglycemic episodes. Continue glipizide.  4.  Hypertension: Continue metoprolol.  5.  Hyperlipidemia: Continue simvastatin.  6.  History of peripheral vascular disease: Continue aspirin, Plavix and statin.  7.  Depression: Continue Celexa. 8.  Benign prostatic hypertrophy: Continue Flomax.   CODE STATUS: The patient is a full code.   TIME SPENT WITH ADMISSION: 50 minutes.    ____________________________ Rolly Pancake. Cherlynn Kaiser, MD vjs:jcm D: 07/14/2013 19:02:36 ET T: 07/14/2013 19:47:25 ET JOB#: [id number]  cc: Rolly Pancake. Cherlynn Kaiser, MD, <Dictator> Houston Siren MD ELECTRONICALLY SIGNED 07/25/2013 13:43

## 2014-12-27 NOTE — Discharge Summary (Signed)
PATIENT NAME:  Nicholas [name], VANHISE MR#:  [id number] DATE OF BIRTH:  [birth year]  DATE OF ADMISSION:  01/22/2013 DATE OF DISCHARGE:  01/30/2013  REASON FOR ADMISSION: Confusion, and right lower extremity color change for 3 days.   FINAL DIAGNOSES: 1. Encephalopathy, multifactorial, due to sepsis. Acute ischemia of the lower extremity, and acute renal failure, likely metabolic encephalopathy.  2.  Shock, possibly related to anesthesia and acute blood loss anemia.  3.  Ischemic right lower leg, with infection, cellulitis and gangrene, status post AKA.  4.  Acute renal failure/acute tubular necrosis.  5.  Coronary artery disease, status post recent coronary artery bypass grafting.  6.  Cellulitis of the upper extremity, now resolved.  7.  Diabetes.  8.  Peripheral vascular disease, status post left below-the-knee amputation, now right above-the-knee amputation.  9.  Deep vein thrombosis of the femoral vein; incidental finding on surgery. The patient is a very poor candidate for anticoagulation. For now, we are just going to do Lovenox subcutaneously  40 mg for 6 weeks.   MEDICATIONS AT DISCHARGE: Lisinopril 10 mg once a day, Tamsulosin 0.4 mg once a day, metoprolol 50 mg twice daily, Megace 40 mg, take 20 mL once a day, Plavix 75 mg once a day, glipizide 2.5 mg once daily, cholecalciferol 1000 units twice a day, gabapentin 300 mg twice daily, citalopram 10 mg once a day, vitamin C 500 mg once a day, loperamide p.r.n., diarrhea, fish oil 1200 units twice daily, aspirin 81 mg once a day, loratadine 10 mg once a day, simvastatin 20 mg once a day, omeprazole 20 mg twice daily, Glucerna shake 3 times daily, Lovenox 40 mg subcutaneously every 24 hours; please take this prescription for 6 weeks due to an incidental finding of DVT during surgery, a small blood clot removed from thrombectomy on dead tissue, Norco 5/325 mg one 4 times a day as needed for pain.   FOLLOWUP: With Dr. Wyn Quaker in 2 to 3 weeks for removal of  staples.   Follow up, [facility], 1 to 2 weeks.   Dressing change every 2 to 3 days of the right lower extremity stump, dressed with Vaseline gauze and an Ace dressing.   Any questions on wound care please also contact Dr. Driscilla Grammes office.   FOLLOWUP: Dr. Festus Barren in 2 to 3 weeks, Dr. Tera Mater , [facility] [place] in 1 to 2 weeks.   IMPORTANT RESULTS: Creatinine 2.21 on admission, that was the highest; right now  1.5. Hemoglobin A1c of 7.0.   White count on admission 11.7; right now down to 7.4. Hemoglobin on admission 9.2 from  chronic kidney disease, down to 7.0 after surgery. The patient was transfused 1 unit of red blood cells. His hemoglobin at discharge was 8.8, platelets of 278.   CT scan of the head without contrast: Diffuse atrophy changes; white matter chronic ischemia.   Chest x-ray, portable:  No acute abnormalities.   HOSPITAL COURSE: [name] is a very nice 81 year old gentleman with a history of hypertension, diabetes, coronary artery disease, hyperlipidemia, was sent from nursing home due to confusion and right lower extremity color change.   The patient was confused at the admission, not able to provide much history. According to his son he had a CABG at [facility] due to MI, and it was one of the incisions on the left lower chest, not a full thoracostomy.   The patient was discharged home, and at home he was having some changes of his  mental status for three days, was really confused. He had very poor oral intake. He had some changes in the coloration of the right lower extremity, and this was probably 2 weeks after his CABG, for which he was sent to the ER. In the ER he had significant cyanosis and he was in acute kidney injury. He had no pedal pulses. He was started on a heparin drip.   On examination, the patient was afebrile at 98.7, blood pressure 110/50. His pulse was 76.   His PCO2 was 50, his total CK was 1500, for which the patient was on  rhabdomyolysis. His white blood count was 12.5. His hemoglobin was 9.4, platelets 399.   The patient was admitted with a diagnoses of encephalopathy, acute kidney injury, dehydration peripheral artery disease and rhabdomyolysis, with acute kidney failure. He was on a heparin drip and evaluated by vascular surgery, Dr. Wyn Quaker, thought that the right leg was not salvageable  due to ischemic changes, and apparently they were going on for several days. He got thought an angiogram would not give more options about revascularization, for which the likely outcome was an AKA.   The patient was seen by palliative services to discuss with the family. The family at the beginning was wanting expectant management, but the patient developed worsening of the condition and on the 23rd was taken to the OR. In the OR the diagnosis gangrene, right leg, severe peripheral vascular disease, coronary artery disease, acute renal failure on chronic kidney failure, and he underwent a right above-the-knee amputation successfully.   After that, the patient was sent to the critical care unit. On the critical care unit he was hypotensive, for which he needed to be kept on pressor support with Neo-Synephrine. Prior to surgery the patient was spiking fevers, became septic, and was started on clindamycin and meropenem. Those antibiotics were continued until the following day after surgery and then stopped. Cefazolin was started due to a mild cellulitis of the upper extremity, but at this moment there is no need for it to be continued.   The patient had some improvement of his mental status through this hospitalization, and he is able to be discharged back to the nursing home.   As far as problems:   1.  Encephalopathy, metabolic encephalopathy, multifactorial: It was based on the fact that the patient had sepsis due to infection.  2.  Gangrenous leg, acute renal failure.  3. Shock: The patient developed shock, plus surgery, likely due to  acute blood loss anemia, and effects of the anesthesia and pain medications.   At that moment sepsis has resolved as the patient had an amputation, and the source of infection was totally removed for which the patient would not need any more antibiotics.   Due to the hypotension the patient was kept on meropenem and clindamycin for another day after surgery, but we were able to wean him off pressor support which was given with Neo-Synephrine.     The patient was also transfused 1 unit of packed red blood cells for his acute blood loss anemia and his hypotension resolved.   As far his ischemic right leg it was gangrenous. It  was infected. He had an AKA on 01/26/2013 by Dr. Wyn Quaker, and his incision has been looking very good.   We uncovered the incision today before sending him to the skilled nursing facility, and it looks healing. He has the staples. The staples need to be removed in 3 weeks from now.  As far as his acute kidney injury, it was likely due to ATN secondary to the septic process, dehydration. The patient was kept with IV fluids, and he got a good amount of IV fluids, and he started sounding a little bit congested. No signs of CHF, just a little bit of fluid overload. The patient had some testicular and penile edema secondary to fluids. At this moment the fluids have been stopped. No need for Lasix at the moment unless the problem increases.   I think we just have to have to be careful about his penis and scrotum because he will be delicate, needs to be positioned very well. Recommendations given to the nurse to keep elevated.   Coronary artery disease: The patient okay to start him back on his home medications; metoprolol statin, aspirin and Plavix.   Cellulitis of the RUE  has resolved on cefazolin. No need to continue any more treatment. His diabetes has been stable. Continue insulin, sliding-scale and oral medications as far as his DVT. This was an incidental finding and he it was  mostly in the femoral-popliteal vein. There was a small clot that was removed/thrombectomy.   I discussed at length the case with Dr. Wyn Quakerew. This was a clot that happened on dead tissue, for what is likely that this is related to the infection. This patient is already on the double antiplatelet therapy and he is at high risk of bleeding. We were not committed to starting him on full anticoagulation with Coumadin. He received Lovenox here in the hospital 40 mg subcutaneously, which is a higher dose based on his kidney failure. We are going to continue Lovenox for at least 6 weeks at this dose, instead of fully anticoagulating him due to the patient being at high risk of bleeding, high risk of fall, and being already on double therapy for platelet control due to his recent CABG.   We are aware of the high risk of DVTs after amputation, for which the patient will continue on Lovenox.   I spent about 80 minutes discharging this patient today.     ____________________________ Felipa Furnaceoberto Sanchez Gutierrez, MD rsg:dm D: 01/30/2013 14:35:48 ET T: 01/30/2013 15:13:20 ET JOB#: [id number]  cc: Felipa Furnaceoberto Sanchez Gutierrez, MD, <Dictator> Rhona LeavensJames F. Burnett ShengHedrick, MD Annice NeedyJason S. Dew, MD [facility]  Avishai Reihl Juanda ChanceSANCHEZ GUTIERRE MD ELECTRONICALLY SIGNED 02/01/2013 21:32

## 2014-12-28 NOTE — Consult Note (Signed)
Brief Consult Note: Diagnosis: Major depressive disorder recurrent severe, Delirium secondary to UTI resolved. 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   Patient was seen by consultant.   Consult note dictated.   Recommend further assessment or treatment.   Orders entered.   Comments: [name] has a long h/o untreated depression. His wife, who is terminally ill, is also hospitalized here. He has multiple medical problems including UTI. He was brought to the hospital for AMS, agitation and visual and auditory hallucinations.At the time of my assessment the patient is pleasant, oriented x3, calm and pleasant.  MSE: alert, oriented, slightly irritable which is normal per sone. No behavioral problems, no suicidal/homicidal thoughts, no psychosis.   PLAN: 1. We will start Remeron 7.5 mg at night for depression/anxiety.  2. I will follow along.  Electronic Signatures: Kristine LineaPucilowska, Alexsis Branscom (MD)  (Signed 21-Mar-15 12:32)  Authored: Brief Consult Note   Last Updated: 21-Mar-15 12:32 by Kristine LineaPucilowska, Zakariah Dejarnette (MD)

## 2014-12-28 NOTE — Discharge Summary (Signed)
PATIENT NAME:  Nicholas [name], Nicholas [name] MR#:  [id number] DATE OF BIRTH:  1930/09/25  DATE OF ADMISSION:  11/21/2013 DATE OF DISCHARGE:  11/27/2013  ADMITTING PHYSICIAN: Nicholas Furnaceoberto Sanchez Gutierrez, MD  DISCHARGING PHYSICIAN: Nicholas Baasadhika Nomi Rudnicki, MD  PRIMARY CARE PHYSICIAN: Dr. Desma [name]  CONSULTANTS IN HOSPITAL:  1.  Psychiatric with Dr. Jennet [name]. 2.  Neurology by Dr. Mellody DrownMatthew [name].  3.  Palliative care by Dr. Harvie [name].  DISCHARGE DIAGNOSES: 1.  Visual hallucinations.  2.  Possible dementia, could be Lewy body dementia.  3.  Urinary tract infection.  4.  Peripheral vascular disease status post right above-knee amputation and left below-knee amputation.  5.  Anemia of chronic disease.  6.  Benign prostatic hypertrophy.   DISCHARGE HOME MEDICATIONS: 1.  Flomax 0.4 mg p.o. daily.  2.  Aspirin 81 mg p.o. daily.  3.  Simvastatin 20 mg p.o. at bedtime.  4.  Loperamide p.[name].n. for diarrhea.  5.  Fish oil 1200 mg capsule b.i.d.  6.  Vitamin D 3000 international units b.i.d.  7.  Megace 40 mg/mL oral suspension 20 mL once a day. 8.  Zofran oral disintegrating tablet 4 mg q. 8 hours p.[name].n. for nausea and vomiting.  9.  Metoprolol 25 mg p.o. b.i.d.  10.  Klonopin 0.5 mg p.o. b.i.d. p.[name].n. for anxiety and restlessness.  11.  Percocet 5/325 mg p.o. q. 6 hours p.[name].n. for pain.  12.  Remeron 7.5 mg p.o. at bedtime.   DISCHARGE DIET: Low-sodium.  DISCHARGE ACTIVITY: As tolerated.  FOLLOWUP INSTRUCTIONS: 1.  PCP follow-up in 1 to 2 weeks.  2.  Neurology follow-up in 1 week for dementia studies.  3.  Home hospice services.   LABS AND IMAGING STUDIES PRIOR TO DISCHARGE: UA on admission showing evidence of possible infection. Repeat urinalysis is negative. Urine cultures are negative.   Sodium 137, potassium 4.3, chloride 105, bicarb 28, BUN 14, creatinine 1.1, glucose 92 and calcium of 8.3. Magnesium 1.7. Troponin 0.05. WBC 4.6, hemoglobin 7.9, hematocrit 24, platelet count 226,000.  MRI of the brain  showing moderate to advanced atrophy, mild chronic microvascular ischemic changes and no acute abnormalities noted.   Vitamin B12 is 496 pg/ mL.  CT of the head without contrast showing chronic sinusitis and small vessel ischemic changes. No acute findings.   Chest x-ray: No acute evidence of cardiopulmonary disease. Atherosclerotic changes are seen.   BRIEF HOSPITAL COURSE: [name] is an 81 year old elderly Caucasian gentleman with past medical history significant for hypertension, benign prostatic hypertrophy and peripheral vascular disease with bilateral leg amputations who was living at home with his son, was brought into the hospital secondary to worsening visual hallucinations. 1.  Visual hallucinations and intermittent confusion. The patient says that he has been having hallucinations for several months now but acutely worsened over the last couple of weeks. Also of note, his stressors have been increased over the last couple of weeks. His wife got sick and she has been hospitalized. Actually his wife passed away, placed on comfort care measures while the patient was in the hospital as well. He did not have any further hallucinations here in the hospital. He was pretty alert and oriented, but did have some signs of depression and possible dementia as well. He was evaluated by neurologist. MRI did not show any acute changes but there is a concern that he could have Lewy body dementia. The patient is supposed to follow up with Nicholas [name] in the office next week, which he will keep up his  appointment. Neuroleptics are avoided in patients with possible Lewy body dementia as they might worsen his symptoms. He started on low-dose Remeron and Klonopin was advised to be given for p.[name].n. agitation issues. Disposition has been tough because of the patient's wife passing away while in the hospital, and the patient does not have anybody to care for him at home, other than a son who is not sure if he can  completely care for the patient at home. The patient was adamant about going home so palliative care was involved and they have recommended hospice services and the patient's son was agreeable to that. The patient is a DO NOT RESUSCITATE and will be going home with hospice services.   All his other medications are being adjusted, and he is being discharged home today.   DISCHARGE CONDITION: Guarded with long-term poor prognosis.   DISCHARGE DISPOSITION: Home with hospice services.   TIME SPENT ON DISCHARGE: 45 minutes. ____________________________ Nicholas Baas, MD rk:sb D: 11/27/2013 14:44:33 ET T: 11/27/2013 17:21:51 ET JOB#: [id number]  cc: Nicholas Baas, MD, <Dictator> Rhona Leavens. Burnett Sheng, MD Nicholas Baas MD ELECTRONICALLY SIGNED 11/29/2013 16:07

## 2014-12-28 NOTE — H&P (Signed)
PATIENT NAME:  Nicholas [name], Nicholas [name] MR#:  [id number] DATE OF BIRTH:  [birth year]  DATE OF ADMISSION:  11/21/2013  REASON FOR ADMISSION: Altered mental status, delirium and agitation.   PRIMARY CARE PHYSICIAN: Rhona Leavens. Burnett Sheng, MD  REFERRING PHYSICIAN: Richardean Canal, MD  HISTORY OF PRESENT ILLNESS: This is a nice 81 year old gentleman with history of UTIs and he has not been compliant with treatment. The patient comes today because overnight was agitated, having hallucinations which are visual and auditory. The patient went out on the street today, got lost and overall he is not making much sense. At this moment, the patient seems to be very awake and alert. He is very cooperative. He is not having any hallucinations and I was asked to admit the patient because he is not functioning very well. At this moment, I cannot reach the family. I have been calling the family about it, andit  does not seem like they are available. Apparently, his wife is being admitted to the hospital and now discharged and the family does not seem to be able to take care of him at home. We are going to admit him as an observation as he has altered mental status. At this moment, he is just peculiar-thinking but seems to be alert and oriented x 3.   REVIEW OF SYSTEMS: A 12-system review of systems is done.  CONSTITUTIONAL:  Patient denies fever, fatigue, weakness.  EYES: No blurry vision or double vision. EARS, NOSE, THROAT: No difficulty swallowing or epistaxis.  RESPIRATORY: No shortness of breath or cough.  CARDIOVASCULAR: No chest pain or orthopnea.  GASTROINTESTINAL: No nausea, vomiting or diarrhea.  GENITOURINARY: No dysuria or hematuria. He is being treated with ciprofloxacin for a urinary tract infection but he is not taking them consistently apparently. He has more pills left than he is supposed to.   ENDOCRINE: No polyuria or polydipsia.  HEMATOLOGIC AND LYMPHATIC: No anemia, easy bruising.  SKIN: No rashes, petechiae.   MUSCULOSKELETAL:  Positive amputations of the lower extremities. No joint pain at this moment.  NEUROLOGIC: No numbness or tingling. No CVAs or TIAs. PSYCHIATRIC: Apparently, the patient does not have any psychiatric diagnosis although he takes Depakote, which is a mood stabilizer.   PAST MEDICAL HISTORY: 1.  Diabetes.  2.  Hypertension.  3.  Hyperlipidemia.  4.  Depression.  5.  BPH.   6.  Peripheral vascular disease.  7.  Status post bilateral lower extremity amputation.   ALLERGIES:  No known drug allergies.    PAST SURGICAL HISTORY:  1.  Apparently, the patient had open heart surgery, he does not know why but he has an incision which is under the nipple along the angle of the rib cage.   2.  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    SOCIAL HISTORY: The patient used to smoke cigars. No alcohol abuse. No IV drug abuse. The patient lives with his wife and his son.   FAMILY HISTORY: Positive for COPD on his mother and his father an MI and coronary artery disease.   CURRENT MEDICATIONS: Zofran as needed for nausea, vitamin D 1000 mg twice daily, Flomax 0.4 mg daily, simvastatin 40 mg take 1/2 tablet once a day, metoprolol 50 mg twice daily, Megace 40 mg once a day, loratadine 10 mg once a day, loperamide 2 mg as needed for diarrhea, glipizide 5 mg once a day, gabapentin 300 mg twice daily, fish oil 1200 mg daily, Depakote 250 mg daily,  Plavix 75 mg daily, citalopram 10 mg daily, ciprofloxacin 500 mg every 12 hours, aspirin 81 mg daily, Norco 5/325 mg once a day.   PHYSICAL EXAMINATION: VITAL SIGNS:  Blood pressure 118/57, pulse 62, respirations 18, temperature 97.5. Oxygen saturation 97% on room air.  GENERAL:  The patient is alert and at this moment he is oriented on place, person and overall on time with some guidance. The patient has been stable hemodynamically, no use of accessory muscles. No respiratory distress.  HEENT: Pupils are equal and reactive.  Extraocular movements are intact. Mucosa are dry. Anicteric sclerae. Pink conjunctivae. No oral lesions. No oropharyngeal exudates.  NECK: Supple. No JVD. No thyromegaly. No adenopathy. No carotid bruits. No rigidity.  CARDIOVASCULAR: Regular rate and rhythm. No murmurs, rubs or gallops are appreciated. No displacement of PMI. There is a scar at the level of the rib cage going longitudinal to the rib on the left side which is healed LUNGS: Clear without any wheezing or crepitus. No use of accessory muscles. No dullness to percussion.   ABDOMEN: Soft, nontender, nondistended. No hepatosplenomegaly. No masses. Bowel sounds are positive. No guarding. No rebound.  GENITAL: Negative for external lesions.  EXTREMITIES: No edema, cyanosis or clubbing. Positive right above-the-knee amputation and left below-the-knee amputation.  SKIN: No rashes, petechiae or ulcers.  LYMPHATIC: No lymphadenopathy.  NEUROLOGIC: Cranial nerves II through XII intact. No focal findings.  PSYCHIATRIC: No agitation. The patient is oriented x 3 at this moment.   LABORATORY, DIAGNOSTIC AND RADIOLOGICAL DATA:  Creatinine 1.17, sodium 134, potassium 4, magnesium 7.3. LFTs: Albumin 1.7, total protein 5.8, other LFTs normal. Troponin 0.02. White blood cells 6.7, hemoglobin 8.1. His previous hemoglobin on March 6th was 10, today is 8.1, platelet count 201. Urinalysis has 18 red blood cells, 17 white blood cells. Chest x-ray no acute findings.   ASSESSMENT AND PLAN: This is an 81 year old gentleman with history of hallucinations, delirium.  1.  Delirium. The patient has a delirious state that could be related or not to a urinary tract infection. Apparently, his urinary tract infection has been treated with ciprofloxacin but the patient is not taking his antibiotics. He has some white blood cells, some red blood cells in urine but only 17 and 18, which does not seem to be significant. The patient is going to be treated with Rocephin but I  can tell that the patient has a couple of other problems. One that he is dehydrated and 2 he has some anemia. At this moment, the patient seems to be stable but we are going to admit him as an observation. Rocephin started. Urine culture started.  2.  Urinary tract infection, partially treated. Check urine culture, do intake and output. 3.  Anemia. The patient has a hemoglobin of 8.1. His previous hemoglobin was 10.1 on March 15. The patient denies any significant melena or hematemesis but the patient does not seem to be completely trustworthy. We are going to do guaiacs and we are going to get iron levels. At this moment, there are no signs of acute abdomen, no signs of intra-abdominal bleeding.  4.  As far as his dehydration, we are going to give him IV fluids as he looks very dry and that could be creating problems with his altered mental status and delirium.  5.  The patient has coronary artery disease, for what he is on Plavix. He also has peripheral vascular disease so will continue the Plavix as well for that reason. The patient is also  taking metoprolol and simvastatin. His troponin is negative.  6.  As far as his diabetes, it seems to be well controlled with glipizide. We are going to add insulin sliding scale.  7.  As far as his allergies, continue loratadine  8.  Due to his benign prostatic hypertrophy, continue flomax 9.  Other medical problems seems to be stable. We are going to keep the patient as an observation today. 10.  Gastrointestinal prophylaxis with Protonix.  11.  Deep vein thrombosis prophylaxis with Lovenox. If there is any peripheral bleeding, hold Lovenox.    ____________________________ Felipa Furnaceoberto Sanchez Gutierrez, MD rsg:cs D: 11/21/2013 18:13:00 ET T: 11/21/2013 18:58:06 ET JOB#: [id number]  cc: Felipa Furnaceoberto Sanchez Gutierrez, MD, <Dictator> Mazel Villela Juanda ChanceSANCHEZ GUTIERRE MD ELECTRONICALLY SIGNED 12/02/2013 13:47

## 2014-12-28 NOTE — Consult Note (Signed)
[name] [name]:  Nicholas, [name] MR#:  [id number] DATE OF BIRTH:  02-18-1931  DATE OF ADMISSION: 11/21/2013  DATE OF CONSULTATION:  11/22/2013  REFERRING PHYSICIAN:  Felipa Furnace, MD CONSULTING PHYSICIAN:  Chalisa Kobler B. Ailen Strauch, MD  REASON FOR CONSULTATION: To evaluate hallucinating [name].   IDENTIFYING DATA: Nicholas [name] is an 81 year old male with possible history of depression.   CHIEF COMPLAINT: "I'm okay."   HISTORY OF PRESENT ILLNESS: Nicholas [name], per his son's report, has a long history of untreated depression. He, most of his life, has been drinking heavily, and Nicholas family believes that he was depressed. He has not been drinking lately, and a year or so ago, when he lost his legs, he was put on an antidepressant and Depakote for mood stabilization. Nicholas [name] comes to Nicholas [name] after an episode of intense visual and auditory hallucinations that went on for a few days. Nicholas family noticed that Nicholas [name] was more confused, believed that there were people in Nicholas [name] and was frightened. They brought him to Nicholas [name]. Nicholas [name] does have a history of UTI and has not been compliant with medication. An antibiotic was given initially, and apparently, Nicholas symptoms of delirium have been resolving since. At Nicholas time of my evaluation, Nicholas [name] is alert, oriented x3, pleasant, polite and cooperative. He tells me that he has been depressed since Nicholas time he lost his legs. He tells me that his wife is currently in Nicholas [name] and that worries about her. He is a veteran, stays at home under care of his son as well as caregivers who come to Nicholas [name] for 5 hours a day through Nicholas [name]. He knows that he is in Nicholas [name] because of hallucinations, but denies having any at Nicholas moment. He does not realize that he was lost prior to admission, but he does know that his cognition has been declining. His son is at Nicholas [name], and he informs me about his history of alcoholism, irritable  and angry behavior at that time. He also tells me about his mother, who is severely ill, possibly on her deathbed. He tells me that Nicholas [name] was able to visit with her briefly. Nicholas [name] was evaluated by Dr. Sherryll Burger, neurology, who considers diagnosis of Lewy body dementia. Therefore, certain medications should not be prescribed for this [name]. He had an MRI performed earlier, and it shows vascular changes and some brain involution. Nicholas [name] is open to start medication for depression. Some of his medicines were discontinued on admission. Nicholas son worries about his father's future with his wife gone. It is uncertain if Nicholas [name] will be able survive independently. Nicholas son lives in [place], but returned home a while ago to take care of his ailing mother and father.   [name] PSYCHIATRIC HISTORY: History of alcoholism in Nicholas [name] and possibly untreated depression. No psychiatric hospitalizations. No suicide attempt.   FAMILY PSYCHIATRIC HISTORY: None reported.   [name] MEDICAL HISTORY: Diabetes, hypertension, dyslipidemia, benign prostate hypertrophy, peripheral vascular disease, status post bilateral lower extremity amputations.   ALLERGIES: No known drug allergies.   MEDICATIONS ON ADMISSION: Zofran as needed for nausea, vitamin D 1000 units twice daily, Flomax 0.4 mg daily, simvastatin 40 mg daily, metoprolol 50 mg twice daily, Megace 40 mg daily, loratadine 10 mg daily, loperamide 2 mg as needed for diarrhea, glipizide 5 mg daily, Neurontin 300 mg twice daily, fish oil 1200 mg daily, Depakote 250 mg daily, Plavix 75 mg daily, citalopram 10  mg daily, Cipro 500 mg every 12 hours, aspirin 81 mg daily, Norco 5/325 mg once a day.   MEDICATIONS AT Nicholas TIME OF CONSULTATION: Tylenol as needed, Colace 100 mg twice daily as needed, Lovenox injections, Zofran 4 mg as needed, pantoprazole 40 mg daily, Phenergan as needed, senna 1 tablet twice daily, Rocephin 1 gram every 24 hours.   SOCIAL HISTORY: He was  in Nicholas [name]. He is retired. He used to live with his wife. When she became ill, their son returned home from [place] to take care of them. Nicholas wife is now hospitalized and most likely in critical condition. He has a home health through Nicholas [name] system. It is unclear if Nicholas [name] is able to afford nursing home, according to Nicholas son.  REVIEW OF SYSTEMS: Difficult to obtain. Nicholas [name] denies any pain or discomfort. His appetite is rather poor. He did not eat his supper. He drinks some Ensure. There is weight loss.   PHYSICAL EXAMINATION:  VITAL SIGNS: Blood pressure 143/68, pulse 59, respirations 18, temperature 96.4.  GENERAL: This is an elderly gentleman with both legs amputated, in no acute distress. Nicholas rest of Nicholas physical examination is deferred to his primary attending.   LABORATORY DATA: Chemistries are within normal limits. Blood alcohol level on admission 0. LFTs within normal limits. Troponin less than 0.02. CBC: White blood count 4.6, hemoglobin 7.9, hematocrit 24, platelets 226. Urinalysis with 3+ leukocyte esterase and 17 white cells per field.   ELECTROCARDIOGRAM: Sinus tachycardia with premature supraventricular complexes, low voltage QRS, abnormal EKG.   MENTAL STATUS EXAMINATION: Nicholas [name] is alert and oriented to person, place and situation. He is pleasant, polite and cooperative. He is able to hold a conversation with me until his son walks into Nicholas [name] and takes over. His speech is soft. He maintains good eye contact. He mood is fine with flat affect. Thought process is logical. He denies thoughts of hurting himself or others. He is not delusional or paranoid. He does not appear to attend to internal stimuli, but per family report, has been hallucinating at home, and this is new. Nicholas [name] reportedly, in Nicholas [name], could get confused a little bit for a short period of time, and at that time, maybe experienced some hallucinations, but not to Nicholas extent that they were  observed lately. They coincided with a urinary tract infection. His cognition is impaired, difficult to assess due to lack of cooperation, but he is a good historian, relates well current and remote events. He seems of normal intelligence and fund of knowledge. His insight and judgment are fair as of now.    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  2. Cognitive disorder, not otherwise specified.  3. Remote history of alcohol dependence.  AXIS II: Deferred.  AXIS III: Diabetes, hypertension, dyslipidemia, benign prostatic hypertrophy, peripheral vascular disease, urinary tract infection, status post bilateral lower extremity amputation. AXIS IV: Mental and physical illness, cognitive decline, wife's illness, loss of way of life.  AXIS V: Global assessment of functioning 55.   PLAN:  1. Nicholas son insists that Nicholas [name] is depressed and needs to be treated. I will start Remeron 7.5 mg at night for depression, anxiety. There were periods of agitation and hallucinations. I do not think that it is still a problem, but upon son's request, I will put order for p.r.n. low dose of Seroquel 25 mg as needed for agitation. This medication at Nicholas lower dose is  appropriate for patients with Lewy body dementia, a diagnosis that has not been confirmed. We will avoid anticholinergic medications and medicines with sedative properties  2. I will follow along tomorrow.  3. Nicholas son believes that Nicholas [name] will be discharged within a day or two. Nicholas family considers placement, but they are grief-stricken and preoccupied with taking care of Nicholas dying mother.    ____________________________ Ellin GoodieJolanta B. Jennet MaduroPucilowska, MD jbp:lb D: 11/24/2013 12:28:00 ET T: 11/24/2013 12:54:53 ET JOB#: [id number]  cc: Kalai Baca B. Jennet MaduroPucilowska, MD, <Dictator> Shari ProwsJOLANTA B Madeleyn Schwimmer MD ELECTRONICALLY SIGNED 11/28/2013 7:30

## 2014-12-28 NOTE — Consult Note (Signed)
Brief Consult Note: Diagnosis: Major depressive disorder recurrent severe, Delirium secondary to medical condition.   Patient was seen by consultant.   Consult note dictated.   Recommend further assessment or treatment.   Orders entered.   Comments: [name] has a long h/o untreated depression. His wife is dying in our hospital. he has multiple medical problems. He was brought to the hospital for AMS, agitation and visual and auditory hallucinations.At the time of my assessment the patient is pleasant, oriented x3, calm and pleasant.  PLAN: 1. We will start Remeron 7.5 mg at night for depression/anxiety.  2. Will offer Seroquel 25 mg qid as needed for agitation.  3. I will follow along.  Electronic Signatures: Kristine LineaPucilowska, Sofiah Lyne (MD)  (Signed 20-Mar-15 19:34)  Authored: Brief Consult Note   Last Updated: 20-Mar-15 19:34 by Kristine LineaPucilowska, Wyett Narine (MD)

## 2014-12-28 NOTE — Consult Note (Signed)
Referring Physician:  James Ivanoff, Roselie Awkward :   Reason for Consult: Admit Date: 21-Nov-2013  Chief Complaint: confusion  Reason for Consult: confusion   History of Present Illness: History of Present Illness:   81 yo RHD M presents to [facility] from home secondary to confusion.  Baseline of pt is not quite know but his wife has been admitted.  Per pt, wife does most of ADLs until she got sick recently and then her son picked up these tasks.  He gets himself dressed and bathed with help.  He does not ambulate due to bilateral amputations.  Apparently he knew about his hallucinations which are not occuring now.  He said that he was seeing people that he knew.   ROS:  General denies complaints   HEENT no complaints   Lungs no complaints   Cardiac no complaints   GI no complaints   GU no complaints   Musculoskeletal no complaints   Extremities no complaints   Skin no complaints   Neuro no complaints   Endocrine no complaints   Psych no complaints   Past Medical/Surgical Hx:  MI x 4 per pt:   Depression:   Osteoporosis:   DJD:   GERD:   BPH:   Failure to Thrive:   Ambulatory Dysfunction:   Anemia:   TIA - Transient Ischemic Attack:   Coronary artery disease:   Diabetes:   HTN:   Hyperlipidemia:   Aphasia:   Peripheral vascular disease:   Right leg arterial bypass:   Left BKA:   Past Medical/ Surgical Hx:  Past Medical History as above   Past Surgical History as above   Home Medications: Medication Instructions Last Modified Date/Time  Cipro 500 mg oral tablet 1 tab(s) orally every 12 hours for 10 days 18-Mar-15 16:04  tamsulosin 0.4 mg oral capsule 1 cap(s) orally once a day 18-Mar-15 16:04  metoprolol tartrate 50 mg oral tablet 1 tab(s) orally 2 times a day 18-Mar-15 16:04  clopidogrel 75 mg oral tablet 1 tab(s) orally once a day 18-Mar-15 16:04  glipiZIDE 5 mg oral tablet 1 tab(s) orally once a day 18-Mar-15 16:04  simvastatin 40 mg oral tablet 0.5  tab (20 mg) orally once a day (at bedtime) 18-Mar-15 16:04  aspirin 81 mg oral delayed release tablet 1 tab(s) orally once a day 18-Mar-15 16:04  Zofran ODT 4 mg oral tablet, disintegrating 1 tab(s) orally every 8 hours, As Needed - for Nausea, Vomiting 18-Mar-15 16:04  loperamide 2 mg oral capsule 1 cap(s) orally 4 times a day, As Needed - for Diarrhea 18-Mar-15 16:04  Depakote 250 mg oral delayed release tablet 1 tab(s) orally once a day (at bedtime) 18-Mar-15 16:04  citalopram 10 mg oral tablet 1 tab(s) orally once a day (at bedtime) 18-Mar-15 16:04  Fish Oil 1200 mg oral capsule 1 cap(s) orally 2 times a day 18-Mar-15 16:04  gabapentin 300 mg oral capsule 1 cap(s) orally 2 times a day 18-Mar-15 16:04  Vitamin D3 1000 intl units oral tablet 1 tab(s) orally 2 times a day 18-Mar-15 16:04  acetaminophen-HYDROcodone 325 mg-5 mg oral tablet 1 tab(s) orally 4 times a day, As Needed - for Pain 18-Mar-15 16:04  loratadine 10 mg oral tablet 1 tab(s) orally once a day, As Needed for allergies 18-Mar-15 16:04  megestrol 40 mg/mL oral suspension 20 milliliter(s) (4 teaspoonsful) orally once a day 18-Mar-15 16:04   Allergies:  No Known Allergies:   Allergies:  Allergies NKDA   Social/Family History: Employment Status: retired  Lives With: spouse  Living Arrangements: house  Social History: no tob, no EtOH, no illicits  Family History: no stroke or seizure   Vital Signs: **Vital Signs.:   19-Mar-15 14:36  Vital Signs Type Routine  Temperature Temperature (F) 97.9  Celsius 36.6  Temperature Source oral  Pulse Pulse 74  Respirations Respirations 18  Systolic BP Systolic BP 144  Diastolic BP (mmHg) Diastolic BP (mmHg) 65  Mean BP 91  Pulse Ox % Pulse Ox % 97  Pulse Ox Activity Level  At rest  Oxygen Delivery Room Air/ 21 %   Physical Exam: General: thin, NAD, resting comfortable  HEENT: normocephalic, sclera nonicteric, oropharynx clear  Neck: supple, no JVD, no bruits  Chest: CTA B,  no wheezing, good movement  Cardiac: RRR, no murmurs, no edema, 2+ pulses  Extremities: no C/C/E, FROM in UE, amputations in B LE   Neurologic Exam: Mental Status: alert and oriented x 3, normal speech and language, follows complex commands  Cranial Nerves: PERRLA, EOMI, nl VF, face symmetric, tongue midline, shoulder shrug equal  Motor Exam: 5/5 B, cogwheel rigidity L>R, tremor, mild bradykinesia  Deep Tendon Reflexes: 1+/4 B UE,  Sensory Exam: intact to pinprick, temperature, and vibration intact B  Coordination: F to N WNL   Lab Results: Hepatic:  18-Mar-15 11:49   Bilirubin, Total 0.2  Alkaline Phosphatase 66 (45-117 NOTE: New Reference Range 07/27/13)  SGPT (ALT)  9  SGOT (AST) 19  Total Protein, Serum  5.8  Albumin, Serum  1.7  Routine Micro:  18-Mar-15 14:58   Micro Text Report URINE CULTURE   COMMENT                   NO GROWTH IN 8-12 HOURS   ANTIBIOTIC                       Specimen Source IN AND OUT CATH  Culture Comment NO GROWTH IN 8-12 HOURS  Result(s) reported on 22 Nov 2013 at 11:32AM.  Routine Chem:  18-Mar-15 11:49   Magnesium, Serum 1.8 (1.8-2.4 THERAPEUTIC RANGE: 4-7 mg/dL TOXIC: > 10 mg/dL  -----------------------)  Ethanol, S. < 3  Ethanol % (comp) < 0.003 (Result(s) reported on 21 Nov 2013 at 12:22PM.)  Ammonia, Plasma 22 (Result(s) reported on 21 Nov 2013 at 12:30PM.)  19-Mar-15 05:43   Glucose, Serum 68  BUN 18  Creatinine (comp) 1.18  Sodium, Serum 138  Potassium, Serum 3.9  Chloride, Serum 105  CO2, Serum 27  Calcium (Total), Serum  8.2  Anion Gap  6  Osmolality (calc) 276  eGFR (African American) >60  eGFR (Non-African American)  58 (eGFR values <32mL/min/1.73 m2 may be an indication of chronic kidney disease (CKD). Calculated eGFR is useful in patients with stable renal function. The eGFR calculation will not be reliable in acutely ill patients when serum creatinine is changing rapidly. It is not useful in  patients on dialysis.  The eGFR calculation may not be applicable to patients at the low and high extremes of body sizes, pregnant women, and vegetarians.)  Cardiac:  18-Mar-15 11:49   Troponin I < 0.02 (0.00-0.05 0.05 ng/mL or less: NEGATIVE  Repeat testing in 3-6 hrs  if clinically indicated. >0.05 ng/mL: POTENTIAL  MYOCARDIAL INJURY. Repeat  testing in 3-6 hrs if  clinically indicated. NOTE: An increase or decrease  of 30% or more on serial  testing suggests a  clinically important change)  Routine UA:  18-Mar-15 15:02  Color (UA) Yellow  Clarity (UA) Hazy  Glucose (UA) Negative  Bilirubin (UA) Negative  Ketones (UA) Negative  Specific Gravity (UA) 1.014  Blood (UA) 1+  pH (UA) 5.0  Protein (UA) Negative  Nitrite (UA) Negative  Leukocyte Esterase (UA) 3+ (Result(s) reported on 21 Nov 2013 at 03:39PM.)  RBC (UA) 18 /HPF  WBC (UA) 17 /HPF  Bacteria (UA) TRACE  Epithelial Cells (UA) <1 /HPF  Transitional Epithelial (UA) <1 /HPF  Hyaline Cast (UA) 8 /LPF  Amorphous Crystal (UA) PRESENT (Result(s) reported on 21 Nov 2013 at 03:39PM.)  Routine Hem:  19-Mar-15 05:43   WBC (CBC) 5.0  RBC (CBC)  2.60  Hemoglobin (CBC)  8.8  Hematocrit (CBC)  26.0  Platelet Count (CBC) 214  MCV 100  MCH 33.9  MCHC 33.8  RDW  16.0  Neutrophil % 58.0  Lymphocyte % 25.5  Monocyte % 6.1  Eosinophil % 9.8  Basophil % 0.6  Neutrophil # 2.9  Lymphocyte # 1.3  Monocyte # 0.3  Eosinophil # 0.5  Basophil # 0.0 (Result(s) reported on 22 Nov 2013 at [facility].)   Radiology Results: CT:    18-Mar-15 12:17, CT Head Without Contrast  CT Head Without Contrast   REASON FOR EXAM:    ams  COMMENTS:   May transport without cardiac monitor    PROCEDURE: CT  - CT HEAD WITHOUT CONTRAST  - Nov 21 2013 12:17PM     CLINICAL DATA:  Altered mental status, hallucinations for 6 months    EXAM:  CT HEAD WITHOUT CONTRAST    TECHNIQUE:  Contiguous axial images were obtained from the base of the skull  through the vertex  without intravenous contrast.    COMPARISON:  CT HEAD W/O CM dated 09/18/2013; CT CERVICAL SPINE W/O  CM dated 09/18/2013    FINDINGS:  Significant diffuse atrophy. Moderate low attenuation in the deep  white matter. Chronic lacunar infarcts right cerebellum. No  hemorrhage, extra-axial fluid, or acute infarction. Opacification of  multiple ethmoid air cells with inflammatory change in this right  sphenoid sinus and left frontal sinus. This is similar to the prior  study.     IMPRESSION:  Chronic sinusitis. Chronic small vessel disease. No acute findings.      Electronically Signed    By: Skipper Cliche M.D.    On: 11/21/2013 12:36         Verified By: Rachael Fee, M.D.,   Radiology Impression: Radiology Impression: CT of head personally reviewed by me and shows moderate white matter changes   Impression/Recommendations: Recommendations:   prior notes reviewed by me reviewed by me   Delerium-  improved, etiology is unclear but could be related to UTI vs. displacement of wife Parkinsonism-  there could be underlying Lewy Body dementia which can cause this with hallucinations and dementia hold all sedating medications including VPA and neurotin avoid typical neuroleptics will need neuropsychiatric testing as outpatient MRI of brain to look for diffuse white matter changes continue secondary stroke prevention check ammonia level and LFT/s will follow  Electronic Signatures: Jamison Neighbor (MD)  (Signed 19-Mar-15 18:48)  Authored: REFERRING PHYSICIAN, Consult, History of Present Illness, Review of Systems, PAST MEDICAL/SURGICAL HISTORY, HOME MEDICATIONS, ALLERGIES, Social/Family History, NURSING VITAL SIGNS, Physical Exam-, LAB RESULTS, RADIOLOGY RESULTS, Recommendations   Last Updated: 19-Mar-15 18:48 by Jamison Neighbor (MD)

## 2015-03-21 IMAGING — CT CT HEAD WITHOUT CONTRAST
1 series · 16 of 30 positions shown, 20 images · non-contrast
Comparison: none

REASON FOR EXAM: ams
COMMENTS:

PROCEDURE:     CT  - CT HEAD WITHOUT CONTRAST  - January 22, 2013  [DATE]
RESULT:     History: Altered mental status.
Comparison Study: No prior.

[Series 2: soft tissue · axial · 0.39mm/px · z∈[+237,+377]mm · 16 of 32 slices shown, 20 images]
[im 2/32  brain]
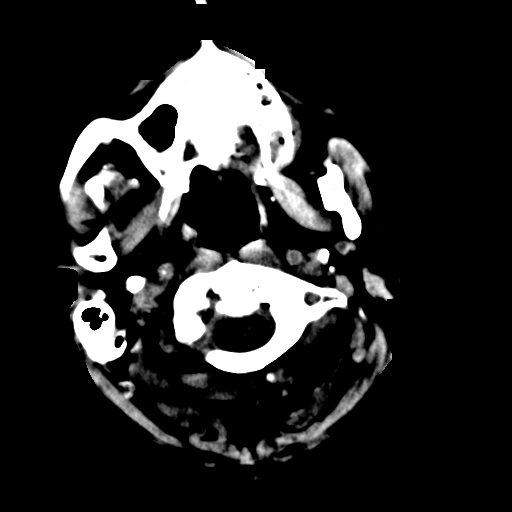
[im 2/32  bone]
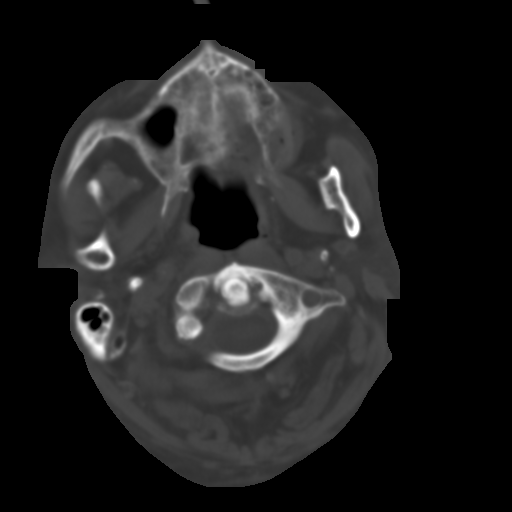
[im 4/32  brain]
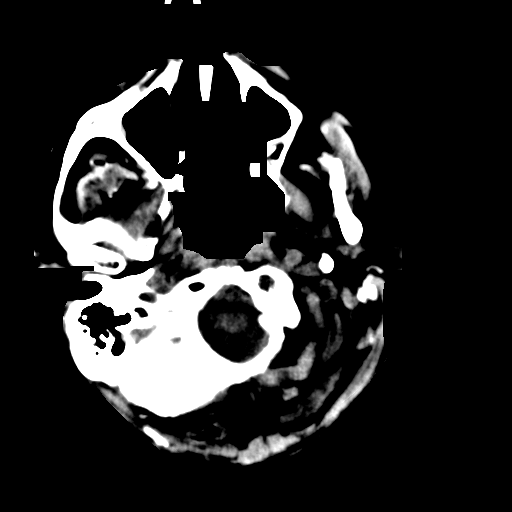
[im 6/32  brain]
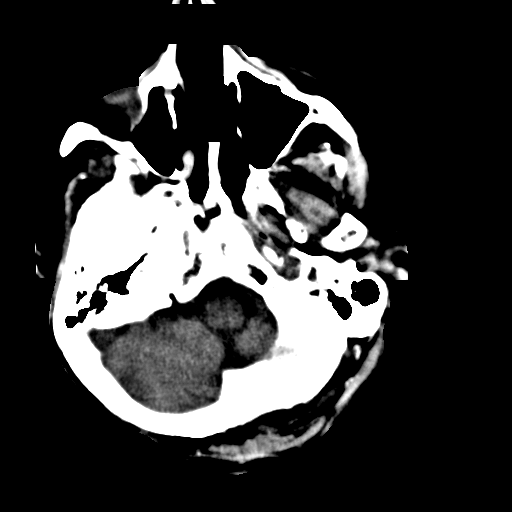
[im 8/32  brain]
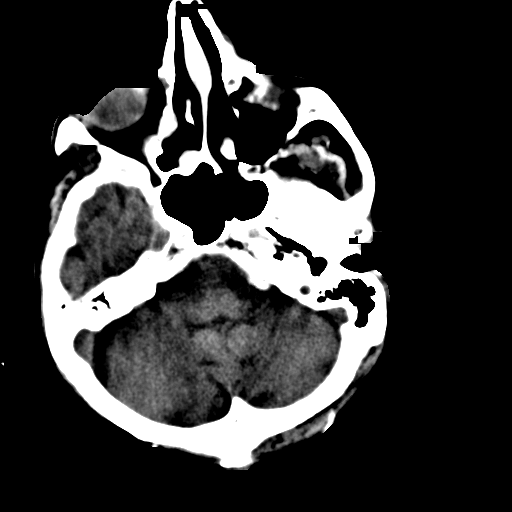
[im 9/32  brain]
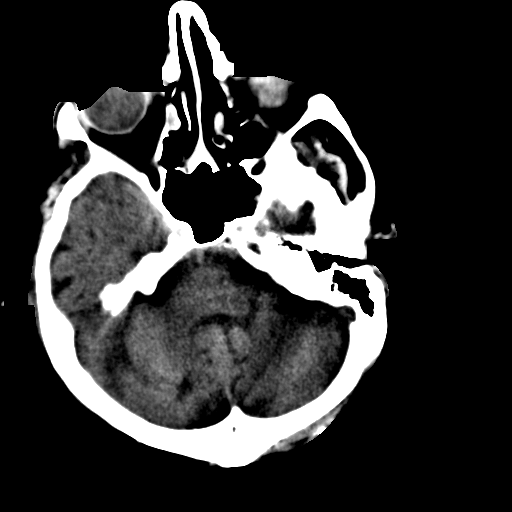
[im 9/32  bone]
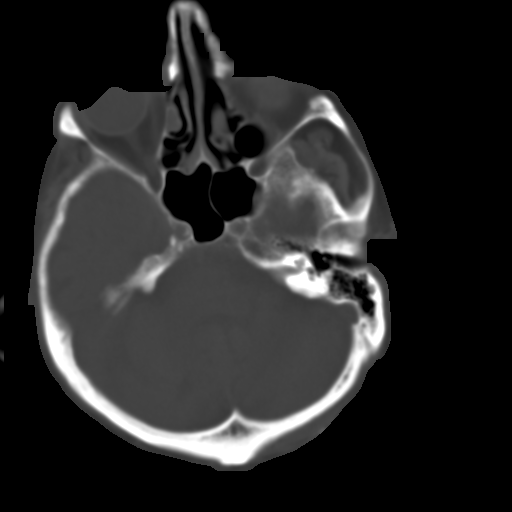
[im 11/32  brain]
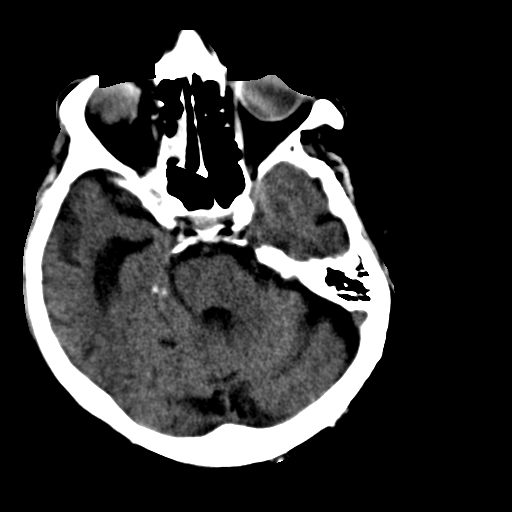
[im 13/32  brain]
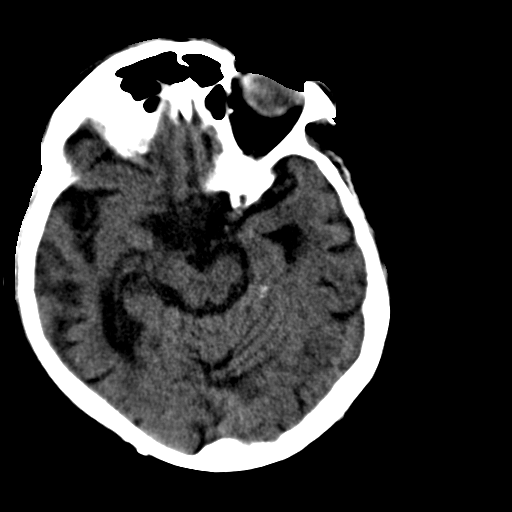
[im 15/32  brain]
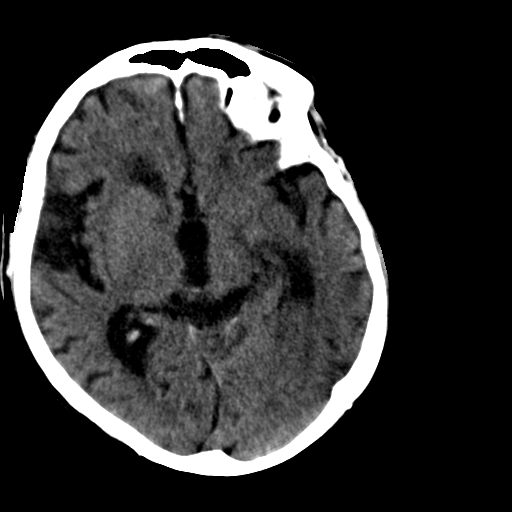
[im 17/32  brain]
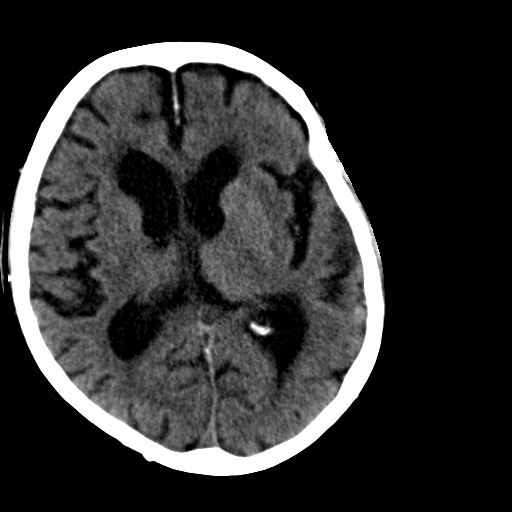
[im 17/32  bone]
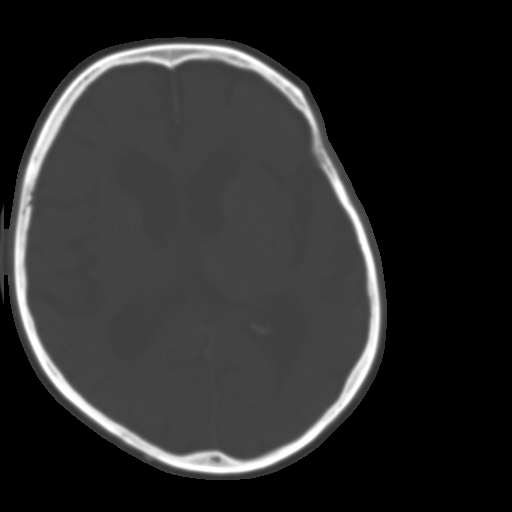
[im 19/32  brain]
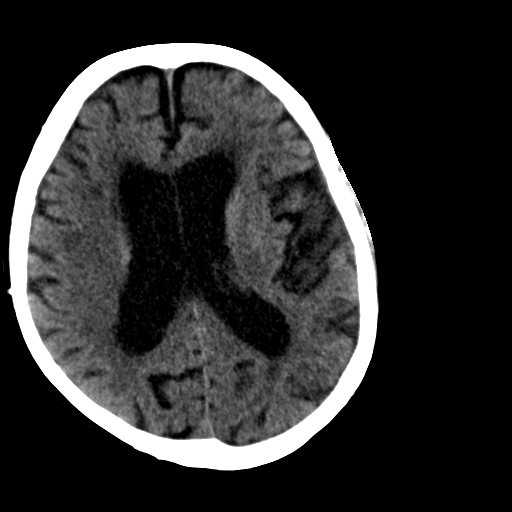
[im 21/32  brain]
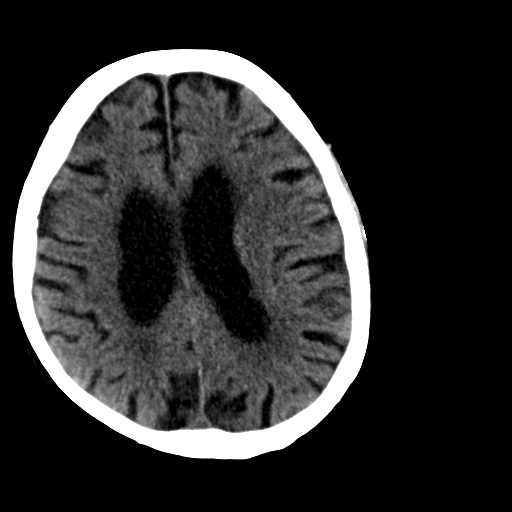
[im 23/32  brain]
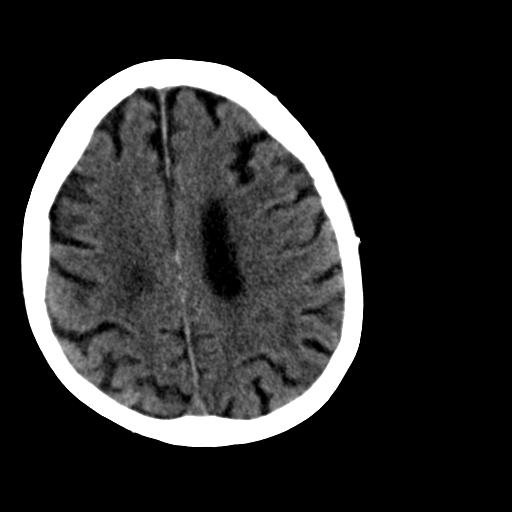
[im 24/32  brain]
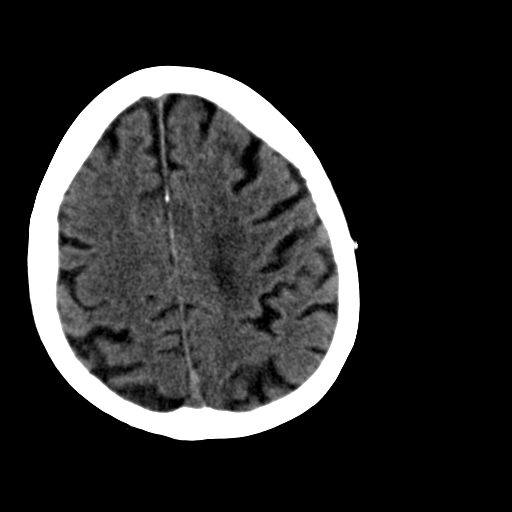
[im 24/32  bone]
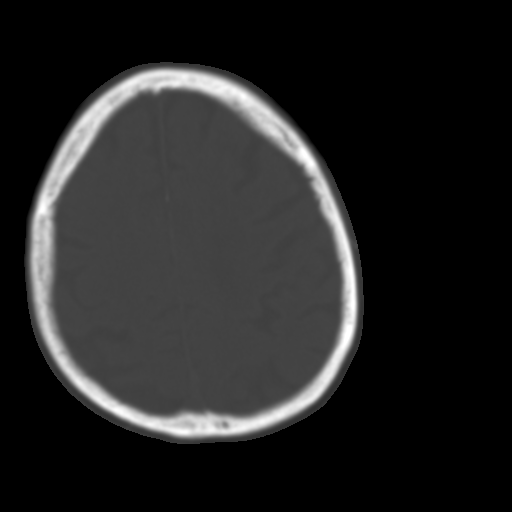
[im 26/32  brain]
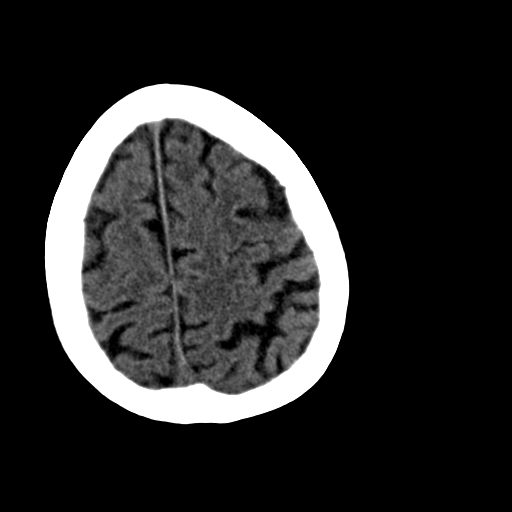
[im 28/32  brain]
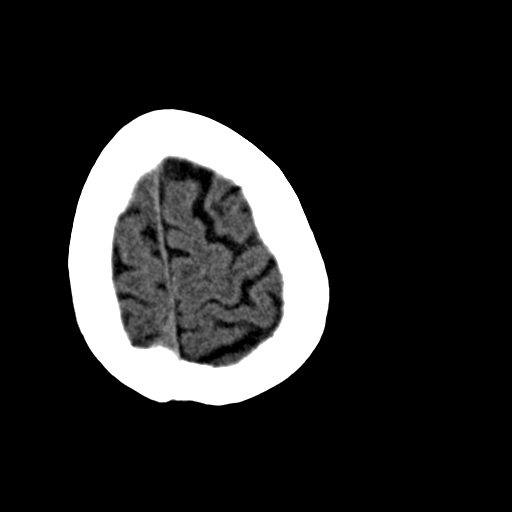
[im 30/32  brain]
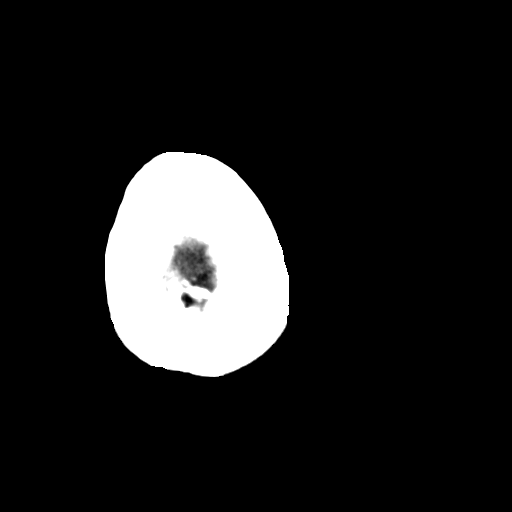

[16 of 30 positions shown; findings below may reference images not displayed]

FINDINGS: Standard nonenhanced CT obtained. No mass. No hydrocephalus. No
hemorrhage. Diffuse atrophy present. White matter changes consistent chronic
ischemia. No acute bony abnormality.
IMPRESSION: Diffuse atrophy and changes of white matter chronic
ischemia. No acute abnormality.

## 2015-03-21 IMAGING — CR DG CHEST 1V PORT
1 series · 1 of 1 positions shown · non-contrast
Comparison: none

REASON FOR EXAM: ams
COMMENTS:

PROCEDURE:     DXR - DXR PORTABLE CHEST SINGLE VIEW  - January 22, 2013  [DATE]
RESULT:     Comparison made to prior study of 12/29/2012. Mediastinum normal.
Lungs clear. Heart size normal.

[ap]
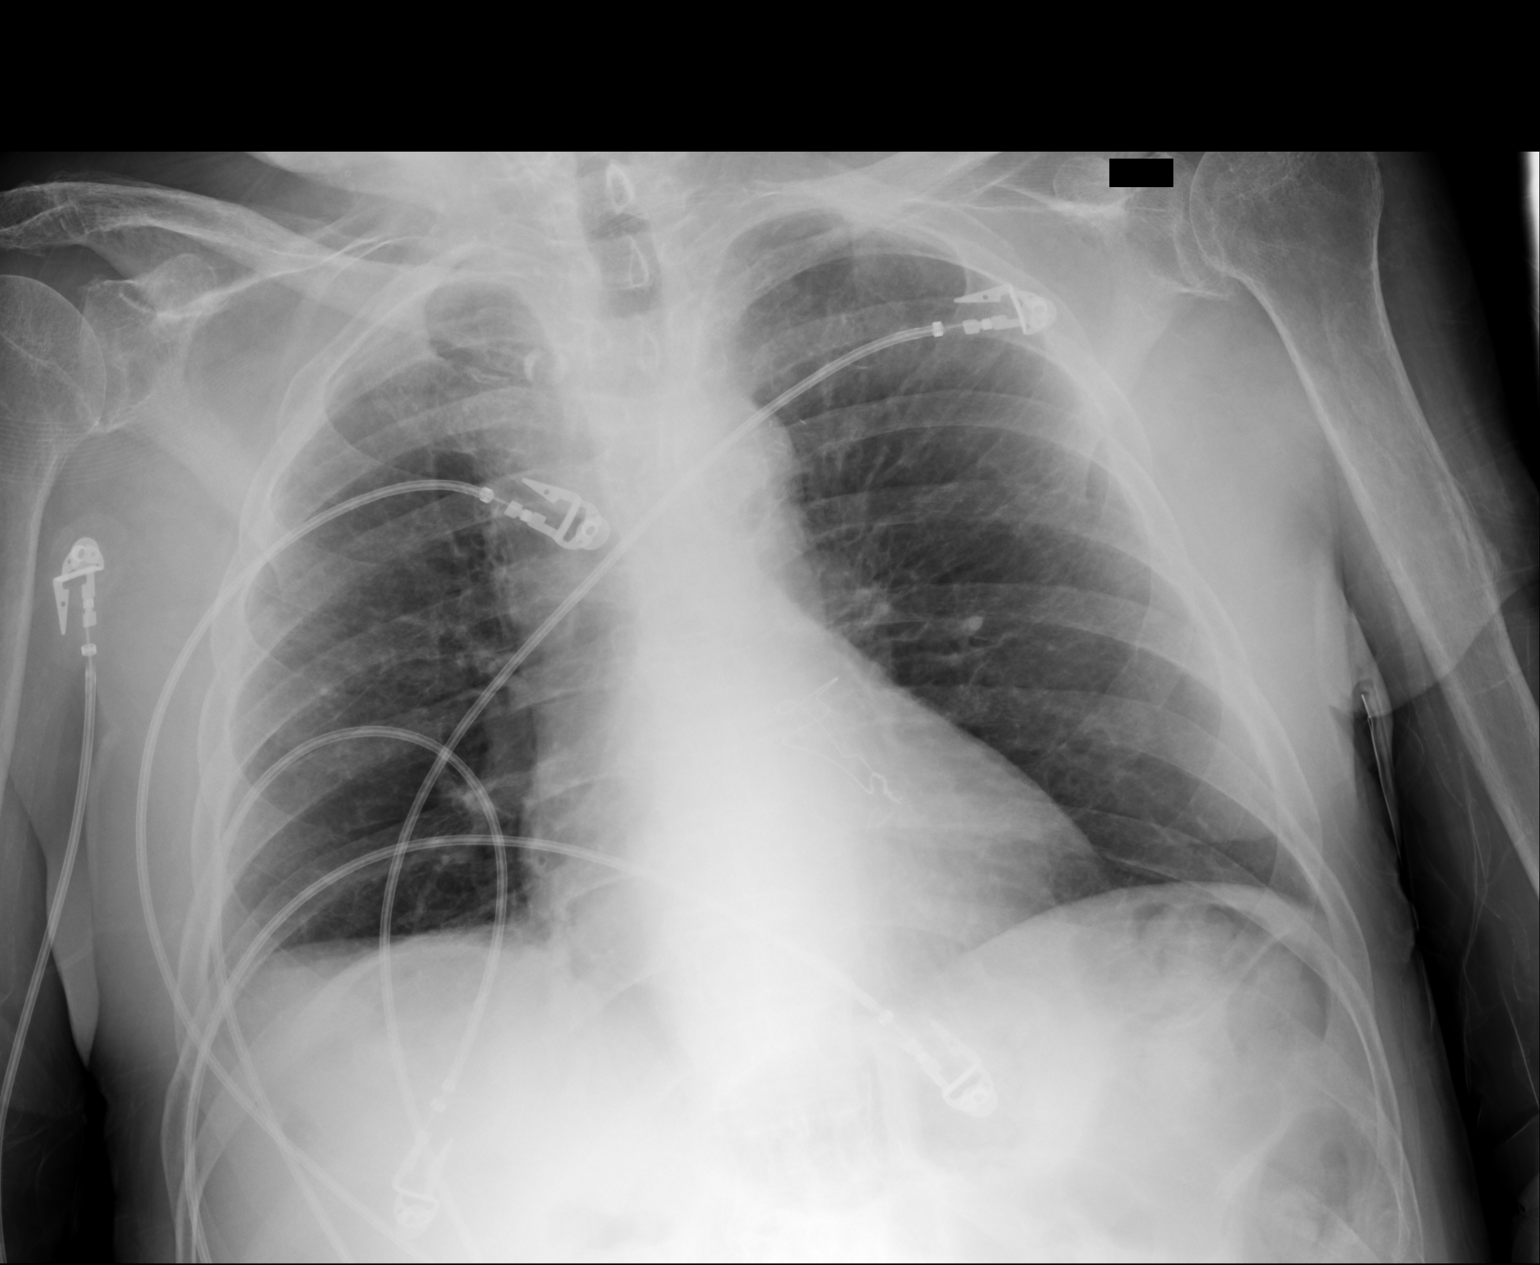

[1 of 1 positions shown; findings below may reference images not displayed]

IMPRESSION: No acute abnormality.

## 2015-08-08 IMAGING — CR DG WRIST COMPLETE 3+V*R*
1 series · 4 of 4 positions shown · non-contrast
Comparison: none

REASON FOR EXAM: Painful, Please do portable X-ray
COMMENTS:

[Series 1: pa · 0.17mm/px · 4 of 4 slices shown]
[im 1/4]
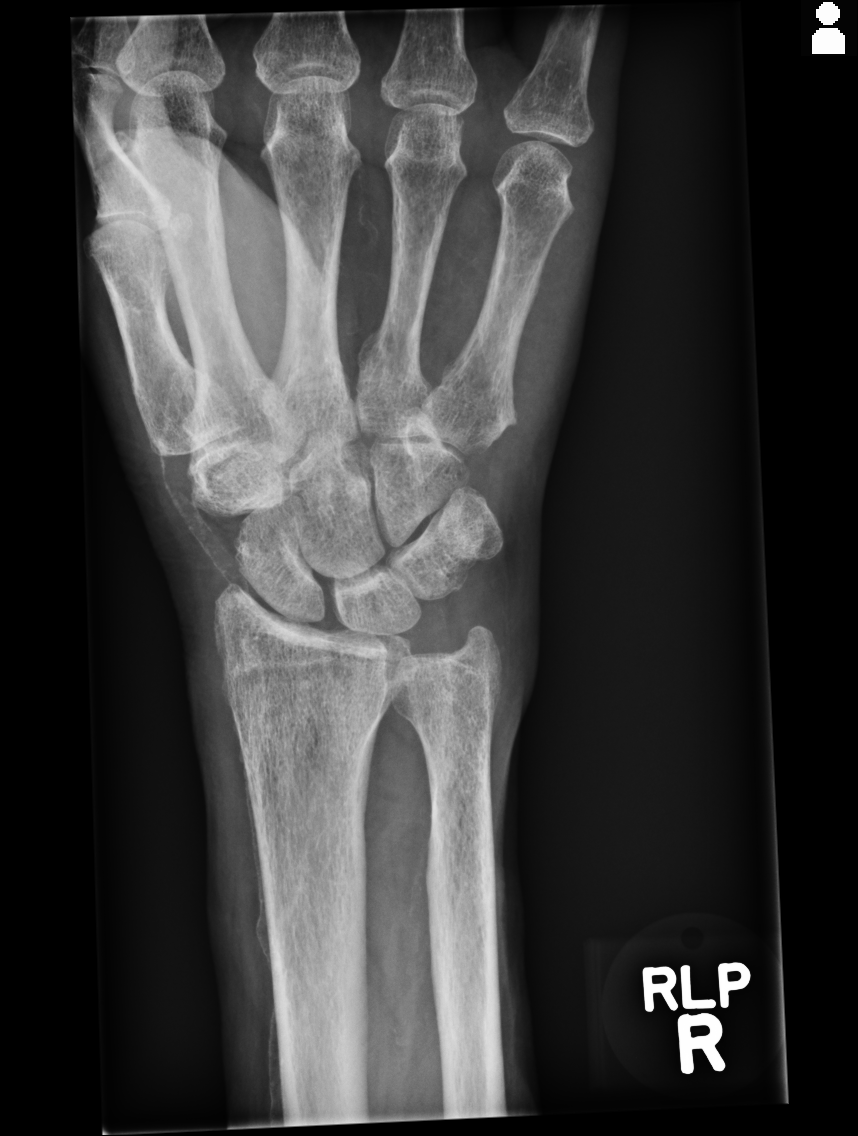
[im 2/4]
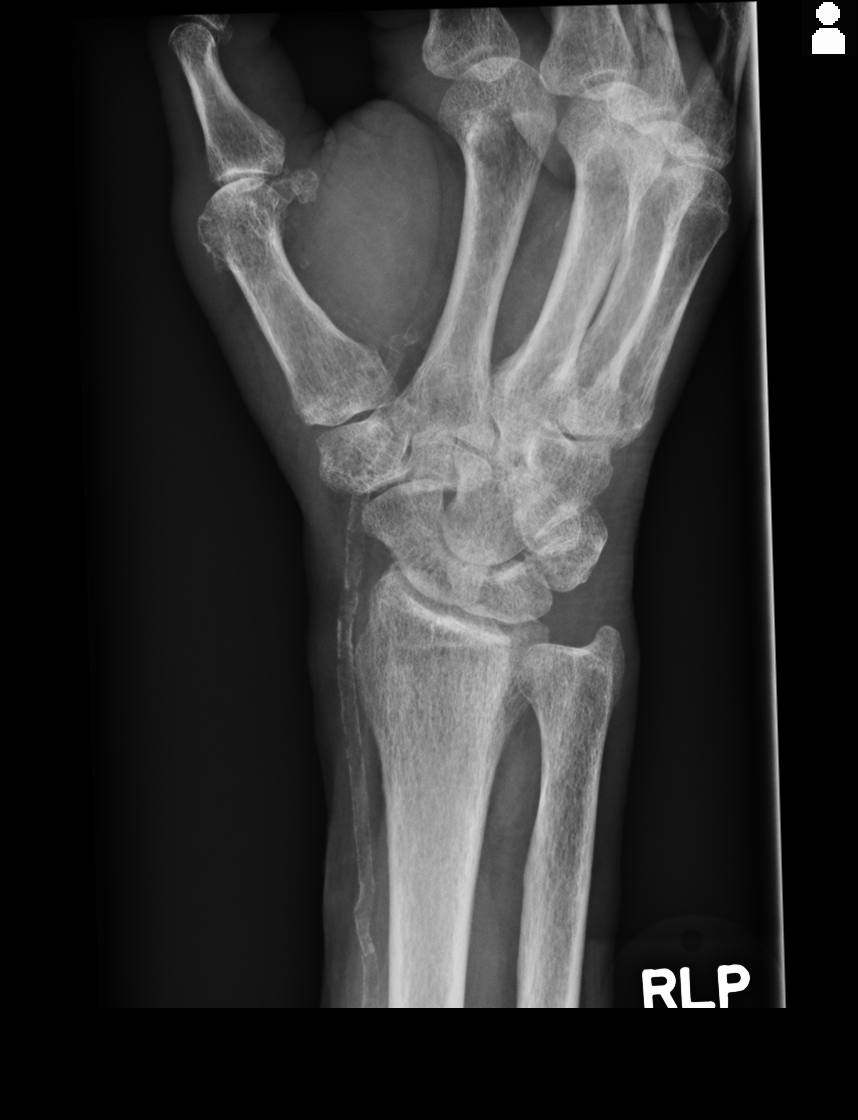
[im 3/4]
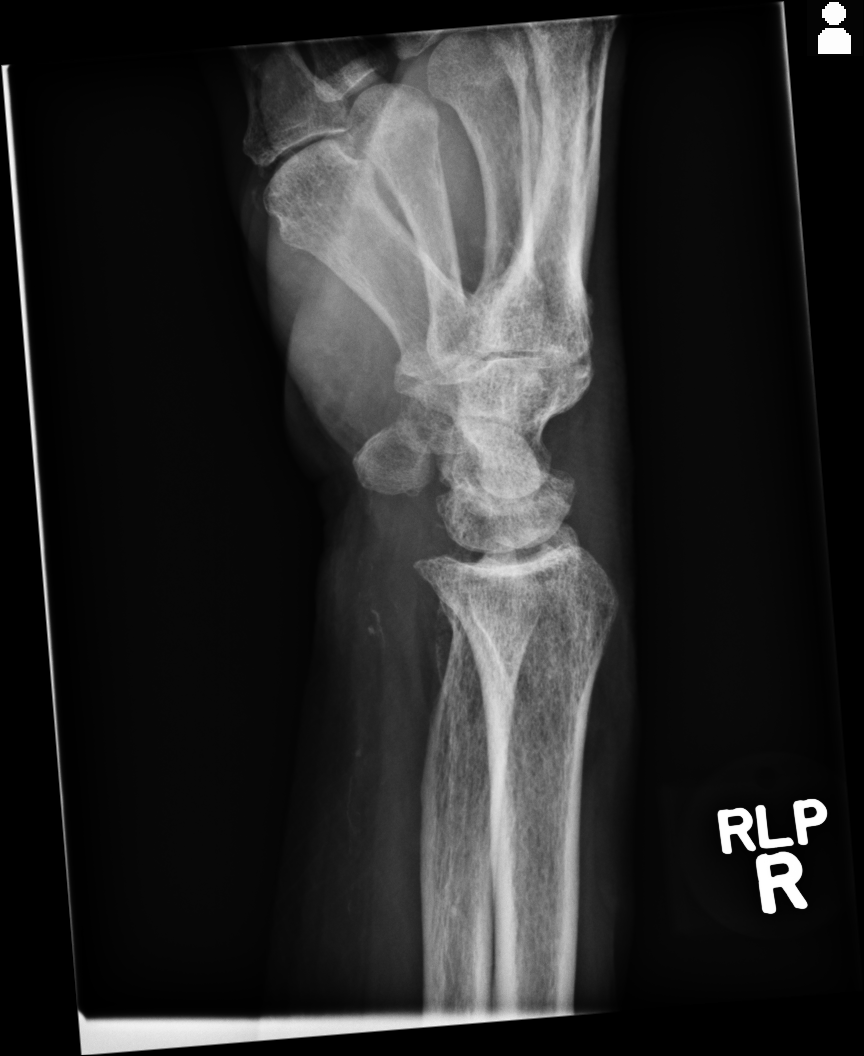
[im 4/4]
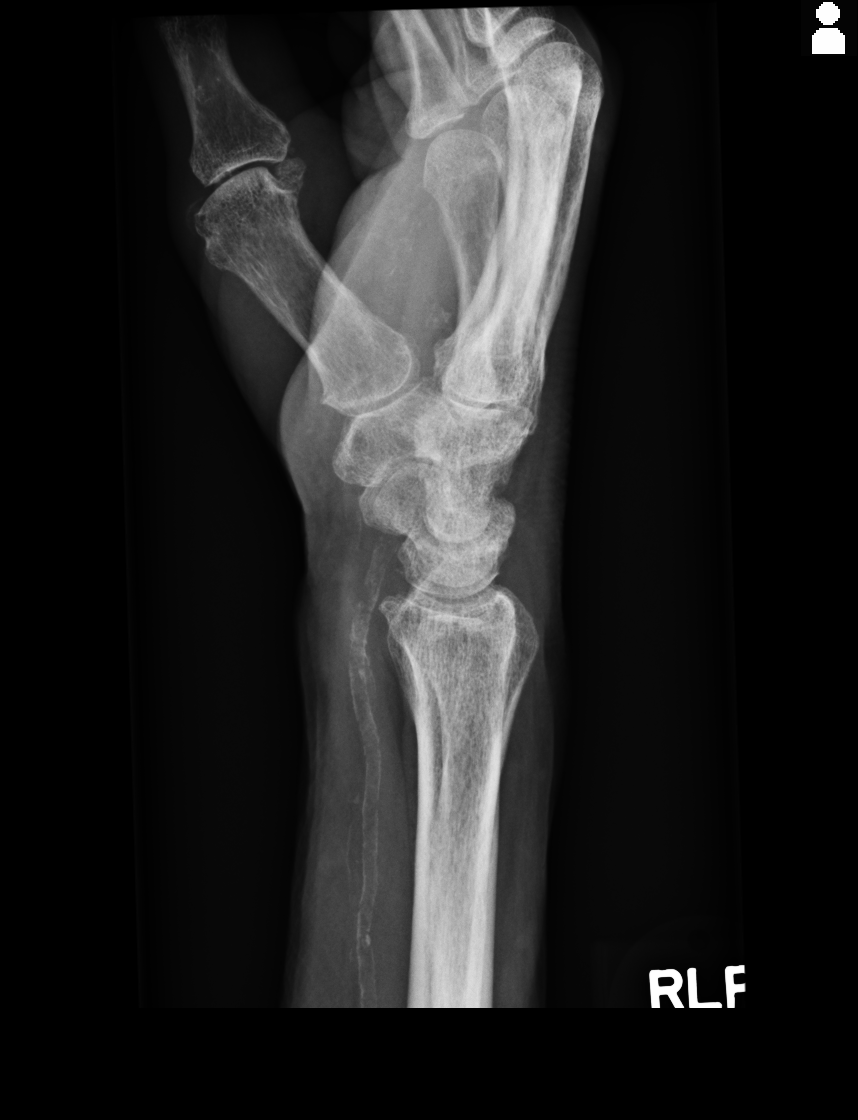

[4 of 4 positions shown; findings below may reference images not displayed]

PROCEDURE:     DXR - DXR WRIST RT COMP WITH OBLIQUES  - June 11, 2013  [DATE]

RESULT:     Right wrist images demonstrate radiocarpal joint space narrowing
with intercarpal degenerative change and carpometacarpal degenerative change
without a definite fracture or dislocation. Prominent atherosclerotic
calcification is present.
IMPRESSION: Chronic changes as described. No acute bony abnormality.

[REDACTED]
